# Patient Record
Sex: Male | Born: 1955 | Race: Black or African American | Hispanic: No | Marital: Married | Smoking: Current every day smoker
Health system: Southern US, Community
[De-identification: ages and names within clinical notes are randomized; demographics above are authoritative.]

## PROBLEM LIST (undated history)

## (undated) DIAGNOSIS — E785 Hyperlipidemia, unspecified: Secondary | ICD-10-CM

## (undated) DIAGNOSIS — E119 Type 2 diabetes mellitus without complications: Secondary | ICD-10-CM

## (undated) DIAGNOSIS — R51 Headache: Secondary | ICD-10-CM

## (undated) DIAGNOSIS — C679 Malignant neoplasm of bladder, unspecified: Secondary | ICD-10-CM

## (undated) DIAGNOSIS — I10 Essential (primary) hypertension: Secondary | ICD-10-CM

## (undated) HISTORY — PX: RECTAL SURGERY: SHX760

## (undated) HISTORY — PX: KNEE ARTHROSCOPY: SUR90

## (undated) HISTORY — PX: NASAL POLYP SURGERY: SHX186

---

## 1989-08-11 DIAGNOSIS — R51 Headache: Secondary | ICD-10-CM

## 1989-08-11 HISTORY — DX: Headache: R51

## 1998-12-14 ENCOUNTER — Encounter: Payer: Self-pay | Admitting: Orthopedic Surgery

## 1998-12-14 ENCOUNTER — Ambulatory Visit (HOSPITAL_COMMUNITY): Admission: RE | Admit: 1998-12-14 | Discharge: 1998-12-14 | Payer: Self-pay | Admitting: Orthopedic Surgery

## 2000-03-19 ENCOUNTER — Ambulatory Visit (HOSPITAL_COMMUNITY): Admission: RE | Admit: 2000-03-19 | Discharge: 2000-03-19 | Payer: Self-pay | Admitting: *Deleted

## 2004-01-28 ENCOUNTER — Ambulatory Visit (HOSPITAL_COMMUNITY): Admission: RE | Admit: 2004-01-28 | Discharge: 2004-01-28 | Payer: Self-pay | Admitting: Chiropractic Medicine

## 2007-04-10 ENCOUNTER — Ambulatory Visit: Payer: Self-pay | Admitting: Cardiology

## 2007-04-25 ENCOUNTER — Ambulatory Visit: Payer: Self-pay

## 2007-04-25 ENCOUNTER — Encounter: Payer: Self-pay | Admitting: Cardiovascular Disease

## 2008-09-25 ENCOUNTER — Ambulatory Visit: Payer: Self-pay | Admitting: Gastroenterology

## 2008-10-09 ENCOUNTER — Ambulatory Visit: Payer: Self-pay | Admitting: Gastroenterology

## 2009-04-10 DEATH — deceased

## 2011-04-28 NOTE — Assessment & Plan Note (Signed)
[facility]                            CARDIOLOGY OFFICE NOTE   [name], [name]                         MRN:          [id number]  DATE:04/10/2007                            DOB:          12/09/1956    REASON FOR CONSULTATION:  Abnormal electrocardiogram.   HISTORY OF PRESENT ILLNESS:  Mr. Gabriel [name] is a pleasant 55 year old male  with a history of hypertension and remote tobacco use as well as  possibly mild hyperlipidemia. He has been on Norvasc over the last three  years or so and states that generally he does fairly well as far as  symptoms. He was seen recently for a routine physical and  electrocardiogram was obtained. The copy of this tracing shows sinus  bradycardia at 57 beats per minute with left ventricular hypertrophy and  probable repolarization abnormalities manifested as ST changes in the  inferolateral leads. Mr. [name] denies any typical exertional chest pain  or limiting dyspnea. He works at [employer] and has to lift fairly heavy objects, doing this  without any significant chest pain. He does describe occasionally when  he gets a cough that he may hurt some under his arms and he also  experiences gas pain relating this to sometimes after he eats fast  foods. He also seems to have increased salt intake in reviewing his  diet. He has not had any prior cardiac risk stratification and Dr.  [name] referral note indicates request for an exercise echocardiogram.  I have reviewed the situation with the patient and discussed basic  lifestyle modification, salt restriction, diet and exercise. We talked  about basic exercise echocardiogram for risk stratification and he is in  agreement.   ALLERGIES:  No known drug allergies.   PRESENT MEDICATIONS:  Norvasc 10 mg p.o. daily.   PAST MEDICAL HISTORY:  Is as outlined above. The patient also reports a  prior history of cluster headaches that were treated  with prednisone and  verapamil, although he has not had problems with this for many years.   SOCIAL HISTORY:  The patient is married. He has three children. He works  at [employer]. He has a remote  tobacco use history, but quit 15 years ago. Denies any alcohol use.   FAMILY HISTORY:  Noncontributory for premature cardiovascular disease in  primary relatives. He does state that he has an aunt perhaps in her late  87s who had coronary artery bypass surgery.   REVIEW OF SYSTEMS:  As described in History of Present Illness,  otherwise negative.   PHYSICAL EXAMINATION:  Blood pressure today is 138/82, heart rate 76,  weight 195 pounds. The patient is normally nourished and in no acute  distress.  HEENT: Conjunctivae is normal. Oropharynx is clear.  NECK: Supple, without elevated jugular pressure, without bruits. No  thyromegaly is noted.  LUNGS:  Are clear without labored breathing at rest.  CARDIAC: Reveals a regular rate and rhythm with soft S4. No loud murmur,  S3 gallop. No pericardial rub.  ABDOMEN: Soft and nontender. No  hepatomegaly. No bruits.  EXTREMITIES: Show no significant pitting edema. Distal pulses are 2+.  SKIN: No ulcer changes.  MUSCULOSKELETAL: No kyphosis.  NEURO/PSYCHIATRIC: The patient is alert and oriented x3, affect is  normal.   IMPRESSION:  1. Abnormal electrocardiogram in the setting of hypertension, most      likely consistent with left ventricular hypertrophy and      repolarization abnormalities. Other cardiac risk factors include      gender and potentially mild hyperlipidemia. He has had no further      cardiac risk stratification. Our plan will be an exercise      echocardiogram. I expect that if this is overall low risk he will      not need any additional cardiac testing at this time and can be      followed along by Dr.  Perrin Maltese for basic lifestyle modification. We      will plan to let him know the results and  can arrange followup from      there as necessary.  2. Further plans to follow.     [name], MD  Electronically Signed    SGM/MedQ  DD: 04/10/2007  DT: 04/10/2007  Job #: [id number]   cc:   [name], M.D.

## 2011-08-17 ENCOUNTER — Ambulatory Visit: Payer: Self-pay

## 2011-08-17 ENCOUNTER — Other Ambulatory Visit: Payer: Self-pay | Admitting: Occupational Medicine

## 2011-08-17 DIAGNOSIS — Z021 Encounter for pre-employment examination: Secondary | ICD-10-CM

## 2011-12-12 DIAGNOSIS — E119 Type 2 diabetes mellitus without complications: Secondary | ICD-10-CM

## 2011-12-12 HISTORY — DX: Type 2 diabetes mellitus without complications: E11.9

## 2013-09-01 ENCOUNTER — Other Ambulatory Visit: Payer: Self-pay | Admitting: Urology

## 2013-09-04 ENCOUNTER — Encounter (HOSPITAL_BASED_OUTPATIENT_CLINIC_OR_DEPARTMENT_OTHER): Payer: Self-pay | Admitting: *Deleted

## 2013-09-08 ENCOUNTER — Encounter (HOSPITAL_BASED_OUTPATIENT_CLINIC_OR_DEPARTMENT_OTHER): Payer: Self-pay | Admitting: *Deleted

## 2013-09-08 NOTE — Progress Notes (Signed)
09/08/13 1006  OBSTRUCTIVE SLEEP APNEA  Have you ever been diagnosed with sleep apnea through a sleep study? No  Do you snore loudly (loud enough to be heard through closed doors)?  1  Do you often feel tired, fatigued, or sleepy during the daytime? 1  Has anyone observed you stop breathing during your sleep? 0  Do you have, or are you being treated for high blood pressure? 1  BMI more than 35 kg/m2? 0  Age over 55 years old? 0  Neck circumference greater than 40 cm/18 inches? 0  Gender: 1  Obstructive Sleep Apnea Score 4

## 2013-09-08 NOTE — Progress Notes (Signed)
To [facility] at 0645 Istat 8,Ekg on arrival-Npo after Mn-instructed to take amlodipine with small amt water that am.

## 2013-09-08 NOTE — Progress Notes (Signed)
This patient has screened at an elevated risk for Obstructive Sleep Apnea using the STOP -Bang tool during a pre-op assessment.

## 2013-09-12 ENCOUNTER — Encounter (HOSPITAL_BASED_OUTPATIENT_CLINIC_OR_DEPARTMENT_OTHER)
Admission: RE | Disposition: A | Discharge status: Home / Self Care | Payer: Self-pay | Source: Ambulatory Visit | Attending: Urology

## 2013-09-12 ENCOUNTER — Encounter (HOSPITAL_BASED_OUTPATIENT_CLINIC_OR_DEPARTMENT_OTHER): Payer: Self-pay | Admitting: Anesthesiology

## 2013-09-12 ENCOUNTER — Other Ambulatory Visit: Payer: Self-pay

## 2013-09-12 ENCOUNTER — Ambulatory Visit (HOSPITAL_BASED_OUTPATIENT_CLINIC_OR_DEPARTMENT_OTHER): Payer: Managed Care, Other (non HMO) | Admitting: Anesthesiology

## 2013-09-12 ENCOUNTER — Encounter (HOSPITAL_BASED_OUTPATIENT_CLINIC_OR_DEPARTMENT_OTHER): Payer: Self-pay | Admitting: *Deleted

## 2013-09-12 ENCOUNTER — Ambulatory Visit (HOSPITAL_BASED_OUTPATIENT_CLINIC_OR_DEPARTMENT_OTHER)
Admission: RE | Admit: 2013-09-12 | Discharge: 2013-09-12 | Disposition: A | Discharge status: Home / Self Care | Payer: Managed Care, Other (non HMO) | Source: Ambulatory Visit | Attending: Urology | Admitting: Urology

## 2013-09-12 ENCOUNTER — Ambulatory Visit (HOSPITAL_COMMUNITY): Payer: Managed Care, Other (non HMO)

## 2013-09-12 DIAGNOSIS — E119 Type 2 diabetes mellitus without complications: Secondary | ICD-10-CM | POA: Insufficient documentation

## 2013-09-12 DIAGNOSIS — C679 Malignant neoplasm of bladder, unspecified: Secondary | ICD-10-CM | POA: Insufficient documentation

## 2013-09-12 DIAGNOSIS — I10 Essential (primary) hypertension: Secondary | ICD-10-CM | POA: Insufficient documentation

## 2013-09-12 DIAGNOSIS — N281 Cyst of kidney, acquired: Secondary | ICD-10-CM | POA: Insufficient documentation

## 2013-09-12 HISTORY — PX: TRANSURETHRAL RESECTION OF BLADDER TUMOR WITH GYRUS (TURBT-GYRUS): SHX6458

## 2013-09-12 HISTORY — DX: Headache: R51

## 2013-09-12 HISTORY — PX: CYSTOSCOPY W/ URETERAL STENT PLACEMENT: SHX1429

## 2013-09-12 HISTORY — DX: Type 2 diabetes mellitus without complications: E11.9

## 2013-09-12 HISTORY — DX: Malignant neoplasm of bladder, unspecified: C67.9

## 2013-09-12 HISTORY — DX: Essential (primary) hypertension: I10

## 2013-09-12 LAB — POCT I-STAT, CHEM 8
BUN: 16 mg/dL (ref 6–23)
Chloride: 103 mEq/L (ref 96–112)
Creatinine, Ser: 0.9 mg/dL (ref 0.50–1.35)
HCT: 38 % — ABNORMAL LOW (ref 39.0–52.0)
Hemoglobin: 12.9 g/dL — ABNORMAL LOW (ref 13.0–17.0)
Potassium: 3.6 mEq/L (ref 3.5–5.1)
Sodium: 143 mEq/L (ref 135–145)

## 2013-09-12 SURGERY — TRANSURETHRAL RESECTION OF BLADDER TUMOR WITH GYRUS (TURBT-GYRUS)
Anesthesia: General | Site: Ureter | Wound class: Clean Contaminated

## 2013-09-12 MED ORDER — SODIUM CHLORIDE 0.9 % IR SOLN
Status: DC | PRN
  Administered 2013-09-12: 9000 mL via INTRAVESICAL

## 2013-09-12 MED ORDER — OXYCODONE-ACETAMINOPHEN 5-325 MG PO TABS: 1.0000 | ORAL_TABLET | ORAL | Status: DC | PRN

## 2013-09-12 MED ORDER — KETOROLAC TROMETHAMINE 30 MG/ML IJ SOLN
INTRAMUSCULAR | Status: DC | PRN
  Administered 2013-09-12: 30 mg via INTRAVENOUS

## 2013-09-12 MED ORDER — SUCCINYLCHOLINE CHLORIDE 20 MG/ML IJ SOLN
INTRAMUSCULAR | Status: DC | PRN
  Administered 2013-09-12: 100 mg via INTRAVENOUS

## 2013-09-12 MED ORDER — FENTANYL CITRATE 0.05 MG/ML IJ SOLN
25.0000 ug | INTRAMUSCULAR | Status: DC | PRN
  Filled 2013-09-12: qty 1

## 2013-09-12 MED ORDER — EPHEDRINE SULFATE 50 MG/ML IJ SOLN
INTRAMUSCULAR | Status: DC | PRN
  Administered 2013-09-12: 5 mg via INTRAVENOUS
  Administered 2013-09-12: 10 mg via INTRAVENOUS

## 2013-09-12 MED ORDER — ONDANSETRON HCL 4 MG/2ML IJ SOLN
INTRAMUSCULAR | Status: DC | PRN
  Administered 2013-09-12: 4 mg via INTRAVENOUS

## 2013-09-12 MED ORDER — LACTATED RINGERS IV SOLN
INTRAVENOUS | Status: DC
  Administered 2013-09-12 (×2): via INTRAVENOUS
  Filled 2013-09-12: qty 1000

## 2013-09-12 MED ORDER — PHENAZOPYRIDINE HCL 200 MG PO TABS: 200.0000 mg | ORAL_TABLET | Freq: Three times a day (TID) | ORAL | Status: DC | PRN

## 2013-09-12 MED ORDER — PROMETHAZINE HCL 25 MG/ML IJ SOLN
6.2500 mg | INTRAMUSCULAR | Status: DC | PRN
  Filled 2013-09-12: qty 1

## 2013-09-12 MED ORDER — DEXAMETHASONE SODIUM PHOSPHATE 4 MG/ML IJ SOLN
INTRAMUSCULAR | Status: DC | PRN
  Administered 2013-09-12: 8 mg via INTRAVENOUS

## 2013-09-12 MED ORDER — IOHEXOL 300 MG/ML  SOLN
INTRAMUSCULAR | Status: DC | PRN
  Administered 2013-09-12: 14 mL

## 2013-09-12 MED ORDER — SENNOSIDES-DOCUSATE SODIUM 8.6-50 MG PO TABS: 1.0000 | ORAL_TABLET | Freq: Two times a day (BID) | ORAL | Status: DC

## 2013-09-12 MED ORDER — GLYCOPYRROLATE 0.2 MG/ML IJ SOLN
INTRAMUSCULAR | Status: DC | PRN
  Administered 2013-09-12: 0.6 mg via INTRAVENOUS

## 2013-09-12 MED ORDER — MIDAZOLAM HCL 5 MG/5ML IJ SOLN
INTRAMUSCULAR | Status: DC | PRN
  Administered 2013-09-12: 2 mg via INTRAVENOUS

## 2013-09-12 MED ORDER — ROCURONIUM BROMIDE 100 MG/10ML IV SOLN
INTRAVENOUS | Status: DC | PRN
  Administered 2013-09-12: 10 mg via INTRAVENOUS
  Administered 2013-09-12: 30 mg via INTRAVENOUS

## 2013-09-12 MED ORDER — EPHEDRINE SULFATE 50 MG/ML IJ SOLN: INTRAMUSCULAR | Status: DC | PRN

## 2013-09-12 MED ORDER — OXYCODONE-ACETAMINOPHEN 5-325 MG PO TABS
1.0000 | ORAL_TABLET | ORAL | Status: DC | PRN
  Administered 2013-09-12: 1 via ORAL
  Filled 2013-09-12: qty 1

## 2013-09-12 MED ORDER — KETOROLAC TROMETHAMINE 30 MG/ML IJ SOLN
15.0000 mg | Freq: Once | INTRAMUSCULAR | Status: DC | PRN
  Filled 2013-09-12: qty 1

## 2013-09-12 MED ORDER — LIDOCAINE HCL (CARDIAC) 20 MG/ML IV SOLN
INTRAVENOUS | Status: DC | PRN
  Administered 2013-09-12: 100 mg via INTRAVENOUS

## 2013-09-12 MED ORDER — GENTAMICIN SULFATE 40 MG/ML IJ SOLN
5.0000 mg/kg | Freq: Once | INTRAVENOUS | Status: DC
  Filled 2013-09-12: qty 11

## 2013-09-12 MED ORDER — GENTAMICIN SULFATE 40 MG/ML IJ SOLN
5.0000 mg/kg | Freq: Once | INTRAVENOUS | Status: AC
  Administered 2013-09-12: 440 mg via INTRAVENOUS
  Filled 2013-09-12: qty 11

## 2013-09-12 MED ORDER — NEOSTIGMINE METHYLSULFATE 1 MG/ML IJ SOLN
INTRAMUSCULAR | Status: DC | PRN
  Administered 2013-09-12: 4 mg via INTRAVENOUS

## 2013-09-12 MED ORDER — PHENAZOPYRIDINE HCL 200 MG PO TABS
200.0000 mg | ORAL_TABLET | Freq: Three times a day (TID) | ORAL | Status: DC
  Administered 2013-09-12: 200 mg via ORAL
  Filled 2013-09-12: qty 1

## 2013-09-12 MED ORDER — FENTANYL CITRATE 0.05 MG/ML IJ SOLN
INTRAMUSCULAR | Status: DC | PRN
  Administered 2013-09-12 (×2): 50 ug via INTRAVENOUS

## 2013-09-12 MED ORDER — GENTAMICIN IN SALINE 1.6-0.9 MG/ML-% IV SOLN
80.0000 mg | INTRAVENOUS | Status: DC
  Filled 2013-09-12: qty 50

## 2013-09-12 MED ORDER — PROPOFOL 10 MG/ML IV BOLUS
INTRAVENOUS | Status: DC | PRN
  Administered 2013-09-12: 200 mg via INTRAVENOUS

## 2013-09-12 SURGICAL SUPPLY — 35 items
BAG DRN ANRFLXCHMBR STRAP LEK (BAG)
BAG URINE DRAINAGE (UROLOGICAL SUPPLIES) IMPLANT
BAG URINE LEG 19OZ MD ST LTX (BAG) IMPLANT
BAG URINE LEG 500ML (DRAIN) IMPLANT
BAG URO CATCHER STRL LF (DRAPE) ×3 IMPLANT
BASKET LASER NITINOL 1.9FR (BASKET) IMPLANT
BASKET ZERO TIP NITINOL 2.4FR (BASKET) IMPLANT
BSKT STON RTRVL 120 1.9FR (BASKET)
BSKT STON RTRVL ZERO TP 2.4FR (BASKET)
CATH FOLEY 2WAY SLVR  5CC 22FR (CATHETERS)
CATH FOLEY 2WAY SLVR 30CC 20FR (CATHETERS) IMPLANT
CATH FOLEY 2WAY SLVR 5CC 22FR (CATHETERS) IMPLANT
CATH INTERMIT  6FR 70CM (CATHETERS) ×3 IMPLANT
CLOTH BEACON ORANGE TIMEOUT ST (SAFETY) ×3 IMPLANT
DRAPE CAMERA CLOSED 9X96 (DRAPES) ×3 IMPLANT
ELECT LOOP MED HF 24F 12D CBL (CLIP) ×1 IMPLANT
ELECT REM PT RETURN 9FT ADLT (ELECTROSURGICAL)
ELECT RESECT VAPORIZE 12D CBL (ELECTRODE) IMPLANT
ELECTRODE REM PT RTRN 9FT ADLT (ELECTROSURGICAL) ×2 IMPLANT
EVACUATOR MICROVAS BLADDER (UROLOGICAL SUPPLIES) IMPLANT
GLOVE BIO SURGEON STRL SZ7.5 (GLOVE) ×3 IMPLANT
GLOVE BIOGEL M STER SZ 6 (GLOVE) ×1 IMPLANT
GLOVE BIOGEL PI IND STRL 6.5 (GLOVE) IMPLANT
GLOVE BIOGEL PI INDICATOR 6.5 (GLOVE) ×1
GOWN PREVENTION PLUS XLARGE (GOWN DISPOSABLE) ×2 IMPLANT
GOWN STRL NON-REIN LRG LVL3 (GOWN DISPOSABLE) ×4 IMPLANT
GUIDEWIRE ANG ZIPWIRE 038X150 (WIRE) ×3 IMPLANT
GUIDEWIRE STR DUAL SENSOR (WIRE) ×3 IMPLANT
IV NS IRRIG 3000ML ARTHROMATIC (IV SOLUTION) ×5 IMPLANT
KIT ASPIRATION TUBING (SET/KITS/TRAYS/PACK) IMPLANT
PACK CYSTOSCOPY (CUSTOM PROCEDURE TRAY) ×3 IMPLANT
SET ASPIRATION TUBING (TUBING) IMPLANT
SYRINGE 10CC LL (SYRINGE) ×3 IMPLANT
SYRINGE IRR TOOMEY STRL 70CC (SYRINGE) ×1 IMPLANT
TUBE FEEDING 8FR 16IN STR KANG (MISCELLANEOUS) ×1 IMPLANT

## 2013-09-12 NOTE — Brief Op Note (Signed)
09/12/2013  9:15 AM  PATIENT:  Gabriel [name]  57 y.o. male  PRE-OPERATIVE DIAGNOSIS:  BLADDER CANCER  POST-OPERATIVE DIAGNOSIS:  BLADDER CANCER  PROCEDURE:  Procedure(s) with comments: TRANSURETHRAL RESECTION OF BLADDER TUMOR WITH GYRUS (TURBT-GYRUS) (N/A) - 1 HR  CYSTOSCOPY WITH BILATERAL  RETROGRADE PYELOGRAM   (Bilateral)  SURGEON:  Surgeon(s) and Role:    * [name], MD - Primary  PHYSICIAN ASSISTANT:   ASSISTANTS: none   ANESTHESIA:   general  EBL:  Total I/O In: 200 [I.V.:200] Out: -   BLOOD ADMINISTERED:none  DRAINS: none   LOCAL MEDICATIONS USED:  NONE  SPECIMEN:  Source of Specimen:  1 - Bladder Tumor, 2 - Base of bladder tumor  DISPOSITION OF SPECIMEN:  PATHOLOGY  COUNTS:  YES  TOURNIQUET:  * No tourniquets in log *  DICTATION: .Other Dictation: Dictation Number [id number]  PLAN OF CARE: Discharge to home after PACU  PATIENT DISPOSITION:  PACU - hemodynamically stable.   Delay start of Pharmacological VTE agent (>24hrs) due to surgical blood loss or risk of bleeding: not applicable

## 2013-09-12 NOTE — Anesthesia Procedure Notes (Signed)
Procedure Name: LMA Insertion Date/Time: 09/12/2013 8:26 AM Performed by: Renella Cunas D Pre-anesthesia Checklist: Patient identified, Emergency Drugs available, Suction available and Patient being monitored Patient Re-evaluated:Patient Re-evaluated prior to inductionOxygen Delivery Method: Circle System Utilized Preoxygenation: Pre-oxygenation with 100% oxygen Intubation Type: IV induction Ventilation: Mask ventilation without difficulty LMA: LMA inserted LMA Size: 5.0 Number of attempts: 1 Airway Equipment and Method: bite block Placement Confirmation: positive ETCO2 Tube secured with: Tape Dental Injury: Teeth and Oropharynx as per pre-operative assessment

## 2013-09-12 NOTE — Progress Notes (Signed)
Dr. Berneice Heinrich called and reported patient unable to void and bladder scan results of 698 ml earlier, see order

## 2013-09-12 NOTE — Op Note (Signed)
NAME:  Gabriel [name], LEGE NO.:  [id number]  MEDICAL RECORD NO.:  [id number]  LOCATION:                                 FACILITY:  PHYSICIAN:  [name], MD     DATE OF BIRTH:  1956-06-01  DATE OF PROCEDURE: 09/12/2013 DATE OF DISCHARGE:                              OPERATIVE REPORT   DIAGNOSIS:  Large volume bladder cancer.  PROCEDURE: 1. Transurethral Resection of bladder tumor volume large. 2. Bilateral retrograde pyelograms with interpretation.  SPECIMEN: 1. Bladder tumor. 2. Base of bladder tumor.  FINDINGS: 1. Massive papillary appearing multifocal bladder tumor involving     approximately 30% total bladder circumference mostly lateral to the     left ureteral orifice.  Separate foci posterior. 2. No evidence of perforation following resection and no evidence of     damage to the ureteral orifices following resection. 3. Unremarkable bilateral pyelogram.  ESTIMATED BLOOD LOSS:  Nil.  INDICATION:  Mr. Gabriel [name] is a pleasant 57 year old gentleman with recent history of gross hematuria.  He was found on workup of this to have a very large bladder mass on CT scan.  He refused office cystoscopy. Options were discussed including operative endoscopy with transection of the presumed bladder tumor bilateral pyelograms.  He wished to proceed. Informed consent was obtained and placed in the medical record.  PROCEDURE IN DETAIL:  The patient being Gabriel [name].  The procedure being transection of bladder tumor with retrograde was confirmed.  Procedure was carried out.  Time-out was performed.  Intravenous antibiotics were administered.  General LMA anesthesia was induced.  The patient was placed into a low lithotomy position.  Sterile field was created by prepping the patient's penis, perineum, proximal thighs using iodine x3. Next, cystourethroscopy was performed using a 22-French rigid scope with 12-degree offset lens.  Inspection of the anterior-posterior  urethra was unremarkable.  Inspection of bladder revealed a massive amount of papillary bladder tumor.  It was multifocal most situated lateral to the left ureteral orifice tracking up towards the dome and a separate foci posteriorly.  This came within about 1.5 cm of the left ureteral orifice but it did not involve the directly.  Attention was directed at retrograde pyelograms 1st on the left side.  The left ureteral orifice cannulated 6-French end-hole catheter and left retrograde pyelogram seen.  Left retrograde pyelogram demonstrated a single left ureter, single system left kidney.  No filling defects or narrowing noted.  Attention was then directed at right retrograde pyelogram.  Right retrograde pyelogram demonstrated a single right ureter, single system, right kidney.  No filling defects or narrowing noted.    Next, the cystoscope was exchanged for a 26-French ACMI continuous flow resectoscope sheath using a medium loop.  Very careful resection was taken down what appeared to be flush with the bladder wall at these separate sites.  Exquisite care was taken to avoid any application of energy within 1 cm of the ureteral orifice to avoid damage which did not occur grossly.  As we came towards the area presumed obturator nerve, it was verified that the patient had muscle relaxation with no twitches and there is no evidence  of obturator reflex during this aspect of the resection.  Next, separate cold cup biopsies were taken what appear to the fibromuscular layer of the deep margin of the bladder tumor.  These were set aside labeled base of bladder tumor.  The remaining bladder tumor fragments irrigated and set aside labeled bladder tumor.  Repeat panendoscopy of the urinary bladder, urethra revealed no evidence of perforation.  Excellent hemostasis.  No obvious residual gross tumor. No evidence of damage to the ureteral orifices.  Photodocumentation was performed pre and post  resection.  It was felt that a Foley catheter was not indicated as such bladder was emptied per cystoscope.  Procedure was then terminated.  The patient tolerated the procedure well.  There were no immediate periprocedural complications.  The patient was taken to postanesthesia care in stable condition.          ______________________________ [name], MD     TM/MEDQ  D:  09/12/2013  T:  09/12/2013  Job:  [id number]

## 2013-09-12 NOTE — Anesthesia Preprocedure Evaluation (Signed)
Anesthesia Evaluation  Patient identified by MRN, date of birth, ID band Patient awake    Reviewed: Allergy & Precautions, H&P , NPO status , Patient's Chart, lab work & pertinent test results  Airway Mallampati: II TM Distance: >3 FB Neck ROM: Full    Dental no notable dental hx.    Pulmonary neg pulmonary ROS,  breath sounds clear to auscultation  Pulmonary exam normal       Cardiovascular hypertension, Pt. on medications Rhythm:Regular Rate:Normal     Neuro/Psych negative neurological ROS  negative psych ROS   GI/Hepatic negative GI ROS, Neg liver ROS,   Endo/Other  negative endocrine ROSdiabetes, Type 2  Renal/GU negative Renal ROS  negative genitourinary   Musculoskeletal negative musculoskeletal ROS (+)   Abdominal   Peds negative pediatric ROS (+)  Hematology negative hematology ROS (+)   Anesthesia Other Findings   Reproductive/Obstetrics negative OB ROS                           Anesthesia Physical Anesthesia Plan  ASA: II  Anesthesia Plan: General   Post-op Pain Management:    Induction: Intravenous  Airway Management Planned: LMA  Additional Equipment:   Intra-op Plan:   Post-operative Plan:   Informed Consent: I have reviewed the patients History and Physical, chart, labs and discussed the procedure including the risks, benefits and alternatives for the proposed anesthesia with the patient or authorized representative who has indicated his/her understanding and acceptance.   Dental advisory given  Plan Discussed with: CRNA and Surgeon  Anesthesia Plan Comments:         Anesthesia Quick Evaluation

## 2013-09-12 NOTE — Progress Notes (Signed)
In and cath using sterile technique, with immediate return 1000 ml orange/ pink urine with small blood clots.

## 2013-09-12 NOTE — Transfer of Care (Signed)
Immediate Anesthesia Transfer of Care Note  Patient: Gabriel [name]  Procedure(s) Performed: Procedure(s) (LRB): TRANSURETHRAL RESECTION OF BLADDER TUMOR WITH GYRUS (TURBT-GYRUS) (N/A) CYSTOSCOPY WITH BILATERAL  RETROGRADE PYELOGRAM   (Bilateral)  Patient Location: PACU  Anesthesia Type: General  Level of Consciousness: awake, oriented, sedated and patient cooperative  Airway & Oxygen Therapy: Patient Spontanous Breathing and Patient connected to face mask oxygen  Post-op Assessment: Report given to PACU RN and Post -op Vital signs reviewed and stable  Post vital signs: Reviewed and stable  Complications: No apparent anesthesia complications

## 2013-09-12 NOTE — H&P (Signed)
Gabriel [name] is an 57 y.o. male.    Chief Complaint: Pre-OP Transurethral Resection of Bladder Tumor  HPI:     1 - Bladder Cancer -  Pt wtih 4cm enhancing left sided bladder mass by CT Urogram on w/u gross hematuria 08/2013. Remote 20PY smoker, now quit. No dye / textile exposure.    2 - Left Simple Renal Cyst - 3.1 cm left lower pole cyst incidetntal on CT Urogram 08/2013. No complex features.   PMH sig for HTN, DM2. No CV disease. No strong blood thinners.  Today Kaymon is seen to proceed with transurethral resection of bladder tumor to further diagnose, stage, and manage his large left bladder wall mass.      Past Medical History  Diagnosis Date  . Bladder cancer   . Hypertension   . Diabetes mellitus without complication 2013    borderline- not taking prescribed med  . Headache(784.0) 1990's    not current problem    Past Surgical History  Procedure Laterality Date  . Knee arthroscopy Right many years ago-    doesn't remember date  . Nasal polyp surgery  date unknown  . Rectal surgery  date unknown    History reviewed. No pertinent family history. Social History:  reports that he quit smoking about 23 years ago. He does not have any smokeless tobacco history on file. He reports that he does not drink alcohol or use illicit drugs.  Allergies: No Known Allergies  No prescriptions prior to admission    No results found for this or any previous visit (from the past 48 hour(s)). No results found.  Review of Systems  Constitutional: Negative.  Negative for fever and chills.  HENT: Negative.   Eyes: Negative.   Respiratory: Negative.   Cardiovascular: Negative.   Gastrointestinal: Negative.   Genitourinary: Positive for hematuria.  Musculoskeletal: Negative.   Skin: Negative.   Neurological: Negative.   Endo/Heme/Allergies: Negative.   Psychiatric/Behavioral: Negative.     Height 6\' 2"  (1.88 m), weight 88.905 kg (196 lb). Physical Exam  Constitutional: He is  oriented to person, place, and time. He appears well-developed and well-nourished.  HENT:  Head: Normocephalic and atraumatic.  Eyes: EOM are normal. Pupils are equal, round, and reactive to light.  Neck: Normal range of motion. Neck supple.  Cardiovascular: Normal rate and regular rhythm.   Respiratory: Effort normal and breath sounds normal.  GI: Soft. Bowel sounds are normal.  Genitourinary: Penis normal.  No CVAT  Musculoskeletal: Normal range of motion.  Neurological: He is alert and oriented to person, place, and time.  Skin: Skin is warm and dry.  Psychiatric: He has a normal mood and affect. His behavior is normal. Judgment and thought content normal.     Assessment/Plan     1 - Bladder Cancer - We rediscussed operative biopsy / transurethral resection as the best next step for diagnostic and therapeutic purposes with goals being to remove all visible cancer and obtain tissue for pathologic exam. We rediscussed that for some low-grade tumors, this may be all the treatment required, but that for many other tumors such as high-grade lesions, further therapy including surgery and or chemotherapy may be warranted. We also outlined the fact that any bladder cancer diagnosis will require close follow-up with periodic upper and lower tract evaluation. We rediscussed risks including bleeding, infection, damage to kidney / ureter / bladder including bladder perforation which can typically managed with prolonged foley catheterization. We rementioned anesthetic and other rare risks including  DVT, PE, MI, and mortality. I also mentioned that adjunctive procedures such as ureteral stenting, retrograde pyelography, and ureteroscopy may be necessary to fully evaluate the urinary tract depending on intra-operative findings.   After answering all questions to the patient's satisfaction, they wish to proceed. I reinforced that he will likely need left ureteral stent given location.  2 - Left Simple Renal  Cyst - non-complex. No mass effect. No indiation for specific surveillance or intervention, observe.   Lonnell Chaput 09/12/2013, 6:17 AM

## 2013-09-12 NOTE — Op Note (Deleted)
NAME:  Gabriel [name], VALLEY NO.:  [id number]  MEDICAL RECORD NO.:  [id number]  LOCATION:                                 FACILITY:  PHYSICIAN:  [name], MD     DATE OF BIRTH:  1956-01-24  DATE OF PROCEDURE: 09/12/2013 DATE OF DISCHARGE:                              OPERATIVE REPORT   DIAGNOSIS:  Large volume bladder cancer.  PROCEDURE: 1. Transurethral Resection of bladder tumor volume large. 2. Bilateral retrograde pyelograms with interpretation.  SPECIMEN: 1. Bladder tumor. 2. Base of bladder tumor.  FINDINGS: 1. Massive papillary appearing multifocal bladder tumor involving     approximately 30% total bladder circumference mostly lateral to the     left ureteral orifice.  Separate foci posterior. 2. No evidence of perforation following resection and no evidence of     damage to the ureteral orifices following resection. 3. Unremarkable bilateral pyelogram.  ESTIMATED BLOOD LOSS:  Nil.  INDICATION:  Gabriel [name] is a pleasant 57 year old gentleman with recent history of gross hematuria.  He was found on workup of this to have a very large bladder mass on CT scan.  He refused office cystoscopy. Options were discussed including operative endoscopy with transection of the presumed bladder tumor bilateral pyelograms.  He wished to proceed. Informed consent was obtained and placed in the medical record.  PROCEDURE IN DETAIL:  The patient being Gabriel [name].  The procedure being transection of bladder tumor with retrograde was confirmed.  Procedure was carried out.  Time-out was performed.  Intravenous antibiotics were administered.  General LMA anesthesia was induced.  The patient was placed into a low lithotomy position.  Sterile field was created by prepping the patient's penis, perineum, proximal thighs using iodine x3. Next, cystourethroscopy was performed using a 22-French rigid scope with 12-degree offset lens.  Inspection of the anterior-posterior  drawer unremarkable.  Inspection of bladder revealed a massive amount of papillary bladder tumor.  It was multifocal most situated lateral to the left ureteral orifice tracking up towards the dome and a separate foci posteriorly.  This came within about 1.5 cm of the left ureteral orifice but it did not involve the directly.  Attention was directed at retrograde pyelograms 1st on the left side.  The left ureteral orifice cannulated 6-French end-hole catheter and left retrograde pyelogram seen.  Left retrograde pyelogram demonstrated a single left ureter, single system left kidney.  No filling defects or narrowing noted.  Attention was then directed at right retrograde pyelogram.  Right retrograde pyelogram demonstrated a single right ureter, single system, right kidney.  No filling defects or narrowing noted.    Next, the cystoscope was exchanged for a 26-French ACMI continuous flow resectoscope sheath using a medium loop.  Very careful resection was taken down what appeared to be flush with the bladder wall at these separate sites.  Exclusive care was taken to avoid any application of energy within 1 cm of the ureteral orifice to avoid damage which did not occur grossly.  As we came towards the area presumed obturator nerve, it was verified that the patient had muscle relaxation with no twitches and there is no evidence of  obturator reflex during this aspect of the resection.  Next, separate cold cup biopsies were taken what appear to the fibromuscular layer of the deep margin of the bladder tumor.  These were set aside labeled base of bladder tumor.  The remaining bladder tumor fragments irrigated and set aside labeled bladder tumor.  Repeat panendoscopy of the urinary bladder, urethra revealed no evidence of perforation.  Excellent hemostasis.  No obvious residual gross tumor. No evidence of damage to the ureteral orifices.  Photodocumentation was performed pre and post resection.   It was felt that a Foley catheter was not indicated as such bladder was emptied per cystoscope.  Procedure was then terminated.  The patient tolerated the procedure well.  There were no immediate periprocedural complications.  The patient was taken to postanesthesia care in stable condition.          ______________________________ [name], MD     TM/MEDQ  D:  09/12/2013  T:  09/12/2013  Job:  [id number]

## 2013-09-12 NOTE — Anesthesia Postprocedure Evaluation (Signed)
  Anesthesia Post-op Note  Patient: Gabriel [name]  Procedure(s) Performed: Procedure(s) (LRB): TRANSURETHRAL RESECTION OF BLADDER TUMOR WITH GYRUS (TURBT-GYRUS) (N/A) CYSTOSCOPY WITH BILATERAL  RETROGRADE PYELOGRAM   (Bilateral)  Patient Location: PACU  Anesthesia Type: General  Level of Consciousness: awake and alert   Airway and Oxygen Therapy: Patient Spontanous Breathing  Post-op Pain: mild  Post-op Assessment: Post-op Vital signs reviewed, Patient's Cardiovascular Status Stable, Respiratory Function Stable, Patent Airway and No signs of Nausea or vomiting  Last Vitals:  Filed Vitals:   09/12/13 0945  BP: 107/64  Pulse: 63  Temp:   Resp: 14    Post-op Vital Signs: stable   Complications: No apparent anesthesia complications

## 2013-09-15 ENCOUNTER — Encounter (HOSPITAL_BASED_OUTPATIENT_CLINIC_OR_DEPARTMENT_OTHER): Payer: Self-pay | Admitting: Urology

## 2014-07-27 ENCOUNTER — Encounter: Payer: Self-pay | Admitting: Gastroenterology

## 2015-08-24 ENCOUNTER — Encounter: Payer: Self-pay | Admitting: Gastroenterology

## 2019-07-02 ENCOUNTER — Other Ambulatory Visit: Payer: Self-pay

## 2019-07-02 DIAGNOSIS — Z20822 Contact with and (suspected) exposure to covid-19: Secondary | ICD-10-CM

## 2019-07-06 LAB — NOVEL CORONAVIRUS, NAA: SARS-CoV-2, NAA: NOT DETECTED

## 2020-07-09 DIAGNOSIS — M62838 Other muscle spasm: Secondary | ICD-10-CM | POA: Diagnosis not present

## 2020-08-09 DIAGNOSIS — I10 Essential (primary) hypertension: Secondary | ICD-10-CM | POA: Diagnosis not present

## 2020-08-09 DIAGNOSIS — M2180 Other specified acquired deformities of unspecified limb: Secondary | ICD-10-CM | POA: Diagnosis not present

## 2020-12-21 DIAGNOSIS — H2513 Age-related nuclear cataract, bilateral: Secondary | ICD-10-CM | POA: Diagnosis not present

## 2021-01-20 DIAGNOSIS — R0789 Other chest pain: Secondary | ICD-10-CM | POA: Diagnosis not present

## 2021-01-25 DIAGNOSIS — E119 Type 2 diabetes mellitus without complications: Secondary | ICD-10-CM | POA: Diagnosis not present

## 2021-02-12 DIAGNOSIS — M79671 Pain in right foot: Secondary | ICD-10-CM | POA: Diagnosis not present

## 2021-02-22 DIAGNOSIS — I739 Peripheral vascular disease, unspecified: Secondary | ICD-10-CM | POA: Diagnosis not present

## 2021-02-22 DIAGNOSIS — M7751 Other enthesopathy of right foot: Secondary | ICD-10-CM | POA: Diagnosis not present

## 2021-02-25 DIAGNOSIS — R0789 Other chest pain: Secondary | ICD-10-CM | POA: Diagnosis not present

## 2021-03-17 DIAGNOSIS — M2011 Hallux valgus (acquired), right foot: Secondary | ICD-10-CM | POA: Diagnosis not present

## 2021-03-17 DIAGNOSIS — M19071 Primary osteoarthritis, right ankle and foot: Secondary | ICD-10-CM | POA: Diagnosis not present

## 2021-04-07 DIAGNOSIS — R2241 Localized swelling, mass and lump, right lower limb: Secondary | ICD-10-CM | POA: Diagnosis not present

## 2021-04-07 DIAGNOSIS — E1159 Type 2 diabetes mellitus with other circulatory complications: Secondary | ICD-10-CM | POA: Diagnosis not present

## 2021-04-07 DIAGNOSIS — I152 Hypertension secondary to endocrine disorders: Secondary | ICD-10-CM | POA: Diagnosis not present

## 2021-04-07 DIAGNOSIS — E785 Hyperlipidemia, unspecified: Secondary | ICD-10-CM | POA: Diagnosis not present

## 2021-04-07 DIAGNOSIS — E1169 Type 2 diabetes mellitus with other specified complication: Secondary | ICD-10-CM | POA: Diagnosis not present

## 2021-04-07 DIAGNOSIS — Z125 Encounter for screening for malignant neoplasm of prostate: Secondary | ICD-10-CM | POA: Diagnosis not present

## 2021-04-07 DIAGNOSIS — E1165 Type 2 diabetes mellitus with hyperglycemia: Secondary | ICD-10-CM | POA: Diagnosis not present

## 2021-04-28 LAB — COLOGUARD

## 2021-05-11 DIAGNOSIS — I152 Hypertension secondary to endocrine disorders: Secondary | ICD-10-CM | POA: Diagnosis not present

## 2021-05-11 DIAGNOSIS — Z23 Encounter for immunization: Secondary | ICD-10-CM | POA: Diagnosis not present

## 2021-05-11 DIAGNOSIS — E1159 Type 2 diabetes mellitus with other circulatory complications: Secondary | ICD-10-CM | POA: Diagnosis not present

## 2021-05-11 DIAGNOSIS — E1165 Type 2 diabetes mellitus with hyperglycemia: Secondary | ICD-10-CM | POA: Diagnosis not present

## 2021-07-04 ENCOUNTER — Encounter: Payer: Self-pay | Admitting: Podiatry

## 2021-07-04 ENCOUNTER — Ambulatory Visit (INDEPENDENT_AMBULATORY_CARE_PROVIDER_SITE_OTHER): Payer: BC Managed Care – PPO | Admitting: Podiatry

## 2021-07-04 ENCOUNTER — Other Ambulatory Visit: Payer: Self-pay

## 2021-07-04 ENCOUNTER — Ambulatory Visit (INDEPENDENT_AMBULATORY_CARE_PROVIDER_SITE_OTHER): Payer: BC Managed Care – PPO

## 2021-07-04 DIAGNOSIS — R200 Anesthesia of skin: Secondary | ICD-10-CM | POA: Diagnosis not present

## 2021-07-04 DIAGNOSIS — M21619 Bunion of unspecified foot: Secondary | ICD-10-CM

## 2021-07-04 DIAGNOSIS — G629 Polyneuropathy, unspecified: Secondary | ICD-10-CM | POA: Diagnosis not present

## 2021-07-06 NOTE — Progress Notes (Signed)
Subjective:   Patient ID: Gabriel [name], male   DOB: 65 y.o.   MRN: [id number]   HPI Patient presents stating he started to develop quite a bit of numbness on the outside of his right foot and also is concerned about structural bunion deformity and does work a weightbearing job extended hours.  He does not smoke and is active   Review of Systems  All other systems reviewed and are negative.      Objective:  Physical Exam Vitals and nursing note reviewed.  Constitutional:      Appearance: He is well-developed.  Pulmonary:     Effort: Pulmonary effort is normal.  Musculoskeletal:        General: Normal range of motion.  Skin:    General: Skin is warm.  Neurological:     Mental Status: He is alert.    Neurovascular status was found to be intact with patient found to have mild diminishment of sharp dull vibratory just in a dermatomal pattern on the outside plantar aspect of the right foot that is localized.  He did not have any muscle strength loss and did have good range of motion and was found to have good digital perfusion well oriented.  He has moderate structural bunion deformity bilateral which is not significantly tender     Assessment:  Possibility for some kind of a neuropraxia versus a possibility of a compression in the back versus the possibility of other unknown cause of neuropathy that is not currently causing balance issues or other pathology     Plan:  H&P spent a great deal time educating him on this and at this point I have recommended a wait-and-see attitude and I do think it will gradually resolve even though if he gets worse we will consider neuro or neurological consult or possibly nerve biopsies of this area.  I advised on bunions do not recommend current treatment but did discuss shoe gear modifications to be undertaken  X-rays indicate significant structural bunion did not indicate any lateral foot pathology right

## 2021-12-06 DIAGNOSIS — M25511 Pain in right shoulder: Secondary | ICD-10-CM | POA: Diagnosis not present

## 2021-12-11 DIAGNOSIS — I739 Peripheral vascular disease, unspecified: Secondary | ICD-10-CM

## 2021-12-11 HISTORY — DX: Peripheral vascular disease, unspecified: I73.9

## 2021-12-19 ENCOUNTER — Ambulatory Visit (INDEPENDENT_AMBULATORY_CARE_PROVIDER_SITE_OTHER): Payer: BC Managed Care – PPO | Admitting: Podiatry

## 2021-12-19 ENCOUNTER — Other Ambulatory Visit: Payer: Self-pay

## 2021-12-19 ENCOUNTER — Encounter: Payer: Self-pay | Admitting: Podiatry

## 2021-12-19 DIAGNOSIS — I999 Unspecified disorder of circulatory system: Secondary | ICD-10-CM

## 2021-12-19 DIAGNOSIS — G629 Polyneuropathy, unspecified: Secondary | ICD-10-CM

## 2021-12-19 DIAGNOSIS — M21619 Bunion of unspecified foot: Secondary | ICD-10-CM

## 2021-12-19 NOTE — Progress Notes (Signed)
Subjective:   Patient ID: Gabriel [name], male   DOB: 66 y.o.   MRN: [id number]   HPI Patient continues to complain of symptoms with his right calf muscle and into the right side of the foot stating that when he does any form of activity it seems to cramp up and occur and its not gotten better over these periods of months since we saw him last and he is not seek any other treatment   ROS      Objective:  Physical Exam  Upon full evaluation neurovascular I did note that there is a diminishment of vascular pulses right over left with PT not able to feel and the DP being weaker.  He upon aggressive questioning appears to be dealing more with claudication symptoms versus neuropathic symptoms     Assessment:  H&P reviewed condition educated Gabriel [name] on condition and the possibility this could be vascular given his claudication-like symptomatology versus neurological      Plan:  H&P reviewed condition with him and discussed different possibilities at great length.  At this point I am getting send him for vascular studies and if it turns out there is reduction consideration for clot and revascularization.  If nothing is found we may have to consider neurological consult or other treatment and it is possible that this will be idiopathic and we cannot find a cause of the cramping he is experiencing.  Put the consultation in today for vascular studies

## 2021-12-23 ENCOUNTER — Other Ambulatory Visit: Payer: Self-pay | Admitting: Podiatry

## 2021-12-23 DIAGNOSIS — I999 Unspecified disorder of circulatory system: Secondary | ICD-10-CM

## 2021-12-30 ENCOUNTER — Ambulatory Visit (HOSPITAL_COMMUNITY)
Admission: RE | Admit: 2021-12-30 | Discharge: 2021-12-30 | Disposition: A | Discharge status: Home / Self Care | Payer: BC Managed Care – PPO | Source: Ambulatory Visit | Attending: Cardiology | Admitting: Cardiology

## 2021-12-30 ENCOUNTER — Other Ambulatory Visit: Payer: Self-pay

## 2021-12-30 DIAGNOSIS — I999 Unspecified disorder of circulatory system: Secondary | ICD-10-CM | POA: Insufficient documentation

## 2022-01-17 ENCOUNTER — Encounter: Payer: Self-pay | Admitting: Cardiovascular Disease

## 2022-01-17 ENCOUNTER — Other Ambulatory Visit: Payer: Self-pay

## 2022-01-17 ENCOUNTER — Ambulatory Visit (INDEPENDENT_AMBULATORY_CARE_PROVIDER_SITE_OTHER): Payer: BC Managed Care – PPO | Admitting: Cardiovascular Disease

## 2022-01-17 VITALS — BP 160/85 | HR 73 | Resp 20 | Ht 74.0 in | Wt 189.0 lb

## 2022-01-17 DIAGNOSIS — E785 Hyperlipidemia, unspecified: Secondary | ICD-10-CM

## 2022-01-17 DIAGNOSIS — R0989 Other specified symptoms and signs involving the circulatory and respiratory systems: Secondary | ICD-10-CM | POA: Diagnosis not present

## 2022-01-17 DIAGNOSIS — I739 Peripheral vascular disease, unspecified: Secondary | ICD-10-CM | POA: Diagnosis not present

## 2022-01-17 DIAGNOSIS — Z720 Tobacco use: Secondary | ICD-10-CM

## 2022-01-17 DIAGNOSIS — Z01812 Encounter for preprocedural laboratory examination: Secondary | ICD-10-CM

## 2022-01-17 DIAGNOSIS — I10 Essential (primary) hypertension: Secondary | ICD-10-CM

## 2022-01-17 DIAGNOSIS — Z01818 Encounter for other preprocedural examination: Secondary | ICD-10-CM | POA: Diagnosis not present

## 2022-01-17 LAB — LIPID PANEL
Chol/HDL Ratio: 5.7 ratio — ABNORMAL HIGH (ref 0.0–5.0)
Cholesterol, Total: 198 mg/dL (ref 100–199)
HDL: 35 mg/dL — ABNORMAL LOW (ref >39)
LDL Chol Calc (NIH): 99 mg/dL (ref 0–99)
Triglycerides: 379 mg/dL — ABNORMAL HIGH (ref 0–149)
VLDL Cholesterol Cal: 64 mg/dL — ABNORMAL HIGH (ref 5–40)

## 2022-01-17 LAB — CBC
Hematocrit: 37 % — ABNORMAL LOW (ref 37.5–51.0)
Hemoglobin: 13.2 g/dL (ref 13.0–17.7)
MCH: 31.3 pg (ref 26.6–33.0)
MCHC: 35.7 g/dL (ref 31.5–35.7)
MCV: 88 fL (ref 79–97)
Platelets: 314 10*3/uL (ref 150–450)
RBC: 4.22 x10E6/uL (ref 4.14–5.80)
RDW: 12.5 % (ref 11.6–15.4)
WBC: 7.4 10*3/uL (ref 3.4–10.8)

## 2022-01-17 LAB — COMPREHENSIVE METABOLIC PANEL
ALT: 16 IU/L (ref 0–44)
AST: 19 IU/L (ref 0–40)
Albumin/Globulin Ratio: 1.3 (ref 1.2–2.2)
Albumin: 4.4 g/dL (ref 3.8–4.8)
Alkaline Phosphatase: 92 IU/L (ref 44–121)
BUN/Creatinine Ratio: 13 (ref 10–24)
BUN: 17 mg/dL (ref 8–27)
Bilirubin Total: 0.3 mg/dL (ref 0.0–1.2)
CO2: 31 mmol/L — ABNORMAL HIGH (ref 20–29)
Calcium: 9.7 mg/dL (ref 8.6–10.2)
Chloride: 98 mmol/L (ref 96–106)
Creatinine, Ser: 1.29 mg/dL — ABNORMAL HIGH (ref 0.76–1.27)
Globulin, Total: 3.4 g/dL (ref 1.5–4.5)
Glucose: 144 mg/dL — ABNORMAL HIGH (ref 70–99)
Potassium: 4 mmol/L (ref 3.5–5.2)
Sodium: 138 mmol/L (ref 134–144)
Total Protein: 7.8 g/dL (ref 6.0–8.5)
eGFR: 62 mL/min/{1.73_m2} (ref >59)

## 2022-01-17 MED ORDER — SODIUM CHLORIDE 0.9% FLUSH: 3.0000 mL | Freq: Two times a day (BID) | INTRAVENOUS | Status: DC

## 2022-01-17 MED ORDER — ATORVASTATIN CALCIUM 20 MG PO TABS: 20.0000 mg | ORAL_TABLET | Freq: Every day | ORAL | 3 refills | Status: DC

## 2022-01-17 NOTE — Progress Notes (Signed)
Cardiology Office Note   Date:  01/17/2022   ID:  Gabriel [name], DOB 1955/12/31, MRN [id number]  PCP:  Bernerd Limbo, MD  Cardiologist:   [name], MD   No chief complaint on file.     History of Present Illness: Gabriel [name] is a 66 y.o. male who was referred by Dr. Paulla Dolly for evaluation and management of peripheral arterial disease. He has no previous cardiac history.  He has known history of essential hypertension, borderline diabetes and previous bladder cancer.  He smokes cigars daily.  He reports family history of coronary artery disease. He was seen by [facility] cardiology last year for atypical chest pain.  He underwent a treadmill nuclear stress test which showed no evidence of ischemia. He reports symptoms of severe right calf claudication and numbness in both feet especially the right side that started less than a year ago and has been progressive since then.  Claudication is now happening after short distance walking and he has to stop and rest before he can resume.  He has no rest pain or lower extremity ulceration.  No chest pain or shortness of breath.  He underwent noninvasive vascular studies which showed an ABI of 0.63 on the right and 0.88 on the left.  Duplex showed severe stenosis in the right distal SFA and moderate left SFA disease.   Past Medical History:  Diagnosis Date   Bladder cancer ([place])    Diabetes mellitus without complication ([place]) 0630   borderline- not taking prescribed med   Headache(784.0) 1990's   not current problem   Hypertension     Past Surgical History:  Procedure Laterality Date   CYSTOSCOPY W/ URETERAL STENT PLACEMENT Bilateral 09/12/2013   Procedure: CYSTOSCOPY WITH BILATERAL  RETROGRADE PYELOGRAM  ;  Surgeon: Alexis Frock, MD;  Location: [facility];  Service: Urology;  Laterality: Bilateral;   KNEE ARTHROSCOPY Right many years ago-   doesn't remember date   NASAL POLYP SURGERY  date unknown   RECTAL SURGERY  date  unknown   TRANSURETHRAL RESECTION OF BLADDER TUMOR WITH GYRUS (TURBT-GYRUS) N/A 09/12/2013   Procedure: TRANSURETHRAL RESECTION OF BLADDER TUMOR WITH GYRUS (TURBT-GYRUS);  Surgeon: Alexis Frock, MD;  Location: [facility];  Service: Urology;  Laterality: N/A;  1 HR      Current Outpatient Medications  Medication Sig Dispense Refill   aspirin 81 MG EC tablet Take 1 tablet by mouth daily.     cloNIDine (CATAPRES) 0.1 MG tablet Take by mouth 2 (two) times daily.     glyBURIDE-metformin (GLUCOVANCE) 2.5-500 MG tablet Take 1 tablet by mouth 2 (two) times daily.     Olmesartan-amLODIPine-HCTZ 40-10-25 MG TABS Take 1 tablet by mouth daily.     oxyCODONE-acetaminophen (ROXICET) 5-325 MG per tablet Take 1 tablet by mouth every 4 (four) hours as needed for pain. Post-operatively (Patient not taking: Reported on 01/17/2022) 30 tablet 0   No current facility-administered medications for this visit.    Allergies:   Patient has no known allergies.    Social History:  The patient  reports that he quit smoking about 31 years ago. His smoking use included cigarettes. He does not have any smokeless tobacco history on file. He reports that he does not drink alcohol and does not use drugs.   Family History:  [name] family history is remarkable for coronary artery disease and hypertension.   ROS:  Please see the history of present illness.   Otherwise, review  of systems are positive for none.   All other systems are reviewed and negative.    PHYSICAL EXAM: VS:  BP (!) 160/85 (BP Location: Left Arm, Patient Position: Sitting, Cuff Size: Normal)    Pulse 73    Resp 20    Ht 6\' 2"  (1.88 m)    Wt 189 lb (85.7 kg)    SpO2 98%    BMI 24.27 kg/m  , BMI Body mass index is 24.27 kg/m. GEN: Well nourished, well developed, in no acute distress  HEENT: normal  Neck: no JVD,  or masses.  Right carotid bruit. Cardiac: RRR; no murmurs, rubs, or gallops,no edema  Respiratory:  clear to  auscultation bilaterally, normal work of breathing GI: soft, nontender, nondistended, + BS MS: no deformity or atrophy  Skin: warm and dry, no rash Neuro:  Strength and sensation are intact Psych: euthymic mood, full affect Vascular: Femoral pulses are normal bilaterally.  Radial pulses are normal.  Distal pulses are not palpable on the right side and faint on the left.   EKG:  EKG is ordered today. The ekg ordered today demonstrates normal sinus rhythm with LVH with repolarization abnormalities.   Recent Labs: No results found for requested labs within last 8760 hours.    Lipid Panel No results found for: CHOL, TRIG, HDL, CHOLHDL, VLDL, LDLCALC, LDLDIRECT    Wt Readings from Last 3 Encounters:  01/17/22 189 lb (85.7 kg)  09/12/13 194 lb 8 oz (88.2 kg)       No flowsheet data found.    ASSESSMENT AND PLAN:  1.  Peripheral arterial disease with severe lifestyle limiting claudication of the right calf Rutherford class III.  I discussed with him the natural history and management of claudication.  I discussed with him the importance of controlling his risk factors.  His symptoms have been progressing and clearly lifestyle limiting.  This has significantly affected his ability to perform his work and daily living.  Due to that, I recommend proceeding with abdominal aortogram with lower extremity runoff and possible endovascular intervention.  I discussed the procedure in details as well as risks and benefits.  2.  Right carotid bruit: I requested carotid Doppler.  3.  Hyperlipidemia: He reports no history of hyperlipidemia but given that he is diabetic and now with diagnosis of peripheral arterial disease, I recommend treatment with a statin.  I added atorvastatin 20 mg daily.  We will check lipid and liver profile with his labs today.  4.  Essential hypertension: He seems to have refractory hypertension.  We will check for renal artery stenosis with the upcoming  angiography.  5.  Cigar use: I discussed with him the importance of cessation.    Disposition: Proceed with a lower extremity angiogram and follow-up in 1 month.  Signed,  [name], MD  01/17/2022 11:23 AM    [place]

## 2022-01-17 NOTE — H&P (View-Only) (Signed)
Cardiology Office Note   Date:  01/17/2022   ID:  Gabriel [name], DOB [birth year], MRN [id number]  PCP:  Bernerd Limbo, MD  Cardiologist:   [name], MD   No chief complaint on file.     History of Present Illness: Gabriel [name] is a 66 y.o. male who was referred by Dr. Paulla Dolly for evaluation and management of peripheral arterial disease. He has no previous cardiac history.  He has known history of essential hypertension, borderline diabetes and previous bladder cancer.  He smokes cigars daily.  He reports family history of coronary artery disease. He was seen by [facility] cardiology last year for atypical chest pain.  He underwent a treadmill nuclear stress test which showed no evidence of ischemia. He reports symptoms of severe right calf claudication and numbness in both feet especially the right side that started less than a year ago and has been progressive since then.  Claudication is now happening after short distance walking and he has to stop and rest before he can resume.  He has no rest pain or lower extremity ulceration.  No chest pain or shortness of breath.  He underwent noninvasive vascular studies which showed an ABI of 0.63 on the right and 0.88 on the left.  Duplex showed severe stenosis in the right distal SFA and moderate left SFA disease.   Past Medical History:  Diagnosis Date   Bladder cancer ([place])    Diabetes mellitus without complication ([place]) 5449   borderline- not taking prescribed med   Headache(784.0) 1990's   not current problem   Hypertension     Past Surgical History:  Procedure Laterality Date   CYSTOSCOPY W/ URETERAL STENT PLACEMENT Bilateral 09/12/2013   Procedure: CYSTOSCOPY WITH BILATERAL  RETROGRADE PYELOGRAM  ;  Surgeon: Alexis Frock, MD;  Location: [facility];  Service: Urology;  Laterality: Bilateral;   KNEE ARTHROSCOPY Right many years ago-   doesn't remember date   NASAL POLYP SURGERY  date unknown   RECTAL SURGERY  date  unknown   TRANSURETHRAL RESECTION OF BLADDER TUMOR WITH GYRUS (TURBT-GYRUS) N/A 09/12/2013   Procedure: TRANSURETHRAL RESECTION OF BLADDER TUMOR WITH GYRUS (TURBT-GYRUS);  Surgeon: Alexis Frock, MD;  Location: [facility];  Service: Urology;  Laterality: N/A;  1 HR      Current Outpatient Medications  Medication Sig Dispense Refill   aspirin 81 MG EC tablet Take 1 tablet by mouth daily.     cloNIDine (CATAPRES) 0.1 MG tablet Take by mouth 2 (two) times daily.     glyBURIDE-metformin (GLUCOVANCE) 2.5-500 MG tablet Take 1 tablet by mouth 2 (two) times daily.     Olmesartan-amLODIPine-HCTZ 40-10-25 MG TABS Take 1 tablet by mouth daily.     oxyCODONE-acetaminophen (ROXICET) 5-325 MG per tablet Take 1 tablet by mouth every 4 (four) hours as needed for pain. Post-operatively (Patient not taking: Reported on 01/17/2022) 30 tablet 0   No current facility-administered medications for this visit.    Allergies:   Patient has no known allergies.    Social History:  The patient  reports that he quit smoking about 31 years ago. His smoking use included cigarettes. He does not have any smokeless tobacco history on file. He reports that he does not drink alcohol and does not use drugs.   Family History:  [name] family history is remarkable for coronary artery disease and hypertension.   ROS:  Please see the history of present illness.   Otherwise, review  of systems are positive for none.   All other systems are reviewed and negative.    PHYSICAL EXAM: VS:  BP (!) 160/85 (BP Location: Left Arm, Patient Position: Sitting, Cuff Size: Normal)    Pulse 73    Resp 20    Ht 6\' 2"  (1.88 m)    Wt 189 lb (85.7 kg)    SpO2 98%    BMI 24.27 kg/m  , BMI Body mass index is 24.27 kg/m. GEN: Well nourished, well developed, in no acute distress  HEENT: normal  Neck: no JVD,  or masses.  Right carotid bruit. Cardiac: RRR; no murmurs, rubs, or gallops,no edema  Respiratory:  clear to  auscultation bilaterally, normal work of breathing GI: soft, nontender, nondistended, + BS MS: no deformity or atrophy  Skin: warm and dry, no rash Neuro:  Strength and sensation are intact Psych: euthymic mood, full affect Vascular: Femoral pulses are normal bilaterally.  Radial pulses are normal.  Distal pulses are not palpable on the right side and faint on the left.   EKG:  EKG is ordered today. The ekg ordered today demonstrates normal sinus rhythm with LVH with repolarization abnormalities.   Recent Labs: No results found for requested labs within last 8760 hours.    Lipid Panel No results found for: CHOL, TRIG, HDL, CHOLHDL, VLDL, LDLCALC, LDLDIRECT    Wt Readings from Last 3 Encounters:  01/17/22 189 lb (85.7 kg)  09/12/13 194 lb 8 oz (88.2 kg)       No flowsheet data found.    ASSESSMENT AND PLAN:  1.  Peripheral arterial disease with severe lifestyle limiting claudication of the right calf Rutherford class III.  I discussed with him the natural history and management of claudication.  I discussed with him the importance of controlling his risk factors.  His symptoms have been progressing and clearly lifestyle limiting.  This has significantly affected his ability to perform his work and daily living.  Due to that, I recommend proceeding with abdominal aortogram with lower extremity runoff and possible endovascular intervention.  I discussed the procedure in details as well as risks and benefits.  2.  Right carotid bruit: I requested carotid Doppler.  3.  Hyperlipidemia: He reports no history of hyperlipidemia but given that he is diabetic and now with diagnosis of peripheral arterial disease, I recommend treatment with a statin.  I added atorvastatin 20 mg daily.  We will check lipid and liver profile with his labs today.  4.  Essential hypertension: He seems to have refractory hypertension.  We will check for renal artery stenosis with the upcoming  angiography.  5.  Cigar use: I discussed with him the importance of cessation.    Disposition: Proceed with a lower extremity angiogram and follow-up in 1 month.  Signed,  [name], MD  01/17/2022 11:23 AM    [place]

## 2022-01-17 NOTE — Patient Instructions (Addendum)
Medication Instructions:  START Atorvastatin 20 mg once daily  *If you need a refill on your cardiac medications before your next appointment, please call your pharmacy*  Testing/Procedures: Your physician has requested that you have a peripheral vascular angiogram. This exam is performed at the hospital. During this exam IV contrast is used to look at arterial blood flow. Please review the information sheet given for details.  Your physician has requested that you have a carotid duplex. This test is an ultrasound of the carotid arteries in your neck. It looks at blood flow through these arteries that supply the brain with blood. Allow one hour for this exam. There are no restrictions or special instructions. This will take place at [place], [address].   Follow-Up: At [facility], you and your health needs are our priority.  As part of our continuing mission to provide you with exceptional heart care, we have created designated Provider Care Teams.  These Care Teams include your primary Cardiologist (physician) and Advanced Practice Providers (APPs -  Physician Assistants and Nurse Practitioners) who all work together to provide you with the care you need, when you need it.  We recommend signing up for the patient portal called "MyChart".  Sign up information is provided on this After Visit Summary.  MyChart is used to connect with patients for Virtual Visits (Telemedicine).  Patients are able to view lab/test results, encounter notes, upcoming appointments, etc.  Non-urgent messages can be sent to your provider as well.   To learn more about what you can do with MyChart, go to [email or web address].    Your next appointment:   Keep your follow up with Suezanne Cheshire, PA at 1:55 on 02/15/22  Other Instructions  [place] [place] [address] Dept: [phone] Loc:  [place]  01/17/2022  You are scheduled for a Peripheral Angiogram on Wednesday, February 15 with Dr. [name].  1. Please arrive at the [facility] (Main Entrance A) at [facility]: [address] [place], [place] [address] at 8:30 AM (This time is two hours before your procedure to ensure your preparation). Free valet parking service is available.   Special note: Every effort is made to have your procedure done on time. Please understand that emergencies sometimes delay scheduled procedures.  2. Diet: Do not eat solid foods after midnight.  The patient may have clear liquids until 5am upon the day of the procedure.  3. Labs: You will need to have blood drawn on 01/17/22  4. Medication instructions in preparation for your procedure: Hold all diabetic medication the morning of the procedure. You will hold the glyburide-metformin the morning of and then 48 hours after.  On the morning of your procedure, take your Aspirin and any morning medicines NOT listed above.  You may use sips of water.  5. Plan for one night stay--bring personal belongings. 6. Bring a current list of your medications and current insurance cards. 7. You MUST have a responsible person to drive you home. 8. Someone MUST be with you the first 24 hours after you arrive home or your discharge will be delayed. 9. Please wear clothes that are easy to get on and off and wear slip-on shoes.  Thank you for allowing [place] to care for you!   -- [facility]

## 2022-01-18 ENCOUNTER — Telehealth: Payer: Self-pay | Admitting: Cardiovascular Disease

## 2022-01-18 NOTE — Telephone Encounter (Signed)
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 Advised the patient he will not be able to drive for 24 hours and no heavy lifting. Advised patient he needs to take off Thursday and maybe Friday.

## 2022-01-18 NOTE — Telephone Encounter (Signed)
Patient called stating he is schedule for a procedure next week Wednesday, he wants to know what if he needs to take Thursday off from work or will he be able to go back to work on Thursday.

## 2022-01-24 ENCOUNTER — Telehealth: Payer: Self-pay | Admitting: *Deleted

## 2022-01-24 NOTE — Telephone Encounter (Signed)
Call placed to patient to review procedure instructions, voicemail message. ?

## 2022-01-24 NOTE — Telephone Encounter (Signed)
Reviewed procedure instructions with patient.  

## 2022-01-24 NOTE — Telephone Encounter (Addendum)
Abdominal aortogram scheduled at [facility] for: Wednesday February 15,2023 10:30 AM [facility] Main Entrance A [facility]) at: 8:30 AM   Diet-no solid food after midnight prior to cath, clear liquids until 5 AM day of procedure.  Medication instructions for procedure: -Hold:  Glucovance-day of procedure and 48 hours post procedure  Olmesartan/amlodipine/HCTZ-AM of procedure -Except hold medications usual morning medications can be taken pre-cath with sips of water including aspirin 81 mg.    Must have responsible adult to drive home post procedure and be with patient first 24 hours after arriving home.  [facility] does allow one visitor to wait in the waiting room during the time you are there.    Reviewed procedure instructions with patient.

## 2022-01-25 ENCOUNTER — Other Ambulatory Visit: Payer: Self-pay | Admitting: *Deleted

## 2022-01-25 ENCOUNTER — Ambulatory Visit (HOSPITAL_COMMUNITY)
Admission: RE | Admit: 2022-01-25 | Discharge: 2022-01-25 | Disposition: A | Discharge status: Home / Self Care | Payer: BC Managed Care – PPO | Attending: Cardiovascular Disease | Admitting: Cardiovascular Disease

## 2022-01-25 ENCOUNTER — Ambulatory Visit (HOSPITAL_COMMUNITY)
Admission: RE | Disposition: A | Discharge status: Home / Self Care | Payer: Self-pay | Source: Home / Self Care | Attending: Cardiovascular Disease

## 2022-01-25 ENCOUNTER — Other Ambulatory Visit: Payer: Self-pay

## 2022-01-25 DIAGNOSIS — I739 Peripheral vascular disease, unspecified: Secondary | ICD-10-CM

## 2022-01-25 DIAGNOSIS — I10 Essential (primary) hypertension: Secondary | ICD-10-CM | POA: Insufficient documentation

## 2022-01-25 DIAGNOSIS — R0989 Other specified symptoms and signs involving the circulatory and respiratory systems: Secondary | ICD-10-CM | POA: Diagnosis not present

## 2022-01-25 DIAGNOSIS — Z8551 Personal history of malignant neoplasm of bladder: Secondary | ICD-10-CM | POA: Diagnosis not present

## 2022-01-25 DIAGNOSIS — Z7984 Long term (current) use of oral hypoglycemic drugs: Secondary | ICD-10-CM | POA: Diagnosis not present

## 2022-01-25 DIAGNOSIS — Z79899 Other long term (current) drug therapy: Secondary | ICD-10-CM | POA: Diagnosis not present

## 2022-01-25 DIAGNOSIS — F17290 Nicotine dependence, other tobacco product, uncomplicated: Secondary | ICD-10-CM | POA: Diagnosis not present

## 2022-01-25 DIAGNOSIS — I70211 Atherosclerosis of native arteries of extremities with intermittent claudication, right leg: Secondary | ICD-10-CM | POA: Diagnosis not present

## 2022-01-25 DIAGNOSIS — Z8249 Family history of ischemic heart disease and other diseases of the circulatory system: Secondary | ICD-10-CM | POA: Diagnosis not present

## 2022-01-25 DIAGNOSIS — E785 Hyperlipidemia, unspecified: Secondary | ICD-10-CM | POA: Diagnosis not present

## 2022-01-25 DIAGNOSIS — E1151 Type 2 diabetes mellitus with diabetic peripheral angiopathy without gangrene: Secondary | ICD-10-CM | POA: Insufficient documentation

## 2022-01-25 HISTORY — PX: ABDOMINAL AORTOGRAM W/LOWER EXTREMITY: CATH118223

## 2022-01-25 HISTORY — PX: PERIPHERAL VASCULAR ATHERECTOMY: CATH118256

## 2022-01-25 LAB — POCT ACTIVATED CLOTTING TIME
Activated Clotting Time: 227 seconds
Activated Clotting Time: 233 seconds

## 2022-01-25 LAB — GLUCOSE, CAPILLARY
Glucose-Capillary: 168 mg/dL — ABNORMAL HIGH (ref 70–99)
Glucose-Capillary: 171 mg/dL — ABNORMAL HIGH (ref 70–99)

## 2022-01-25 SURGERY — ABDOMINAL AORTOGRAM W/LOWER EXTREMITY
Anesthesia: LOCAL | Laterality: Right

## 2022-01-25 MED ORDER — HEPARIN (PORCINE) IN NACL 1000-0.9 UT/500ML-% IV SOLN
INTRAVENOUS | Status: AC
  Filled 2022-01-25: qty 500

## 2022-01-25 MED ORDER — ASPIRIN 81 MG PO CHEW: 81.0000 mg | CHEWABLE_TABLET | ORAL | Status: DC

## 2022-01-25 MED ORDER — CLOPIDOGREL BISULFATE 300 MG PO TABS
ORAL_TABLET | ORAL | Status: DC | PRN
Start: 2022-01-25 — End: 2022-01-25
  Administered 2022-01-25: 300 mg via ORAL

## 2022-01-25 MED ORDER — LIDOCAINE HCL (PF) 1 % IJ SOLN
INTRAMUSCULAR | Status: DC | PRN
  Administered 2022-01-25: 15 mL

## 2022-01-25 MED ORDER — SODIUM CHLORIDE 0.9 % WEIGHT BASED INFUSION: 1.0000 mL/kg/h | INTRAVENOUS | Status: DC

## 2022-01-25 MED ORDER — FENTANYL CITRATE (PF) 100 MCG/2ML IJ SOLN
INTRAMUSCULAR | Status: AC
  Filled 2022-01-25: qty 2

## 2022-01-25 MED ORDER — MIDAZOLAM HCL 2 MG/2ML IJ SOLN
INTRAMUSCULAR | Status: AC
  Filled 2022-01-25: qty 2

## 2022-01-25 MED ORDER — SODIUM CHLORIDE 0.9% FLUSH: 3.0000 mL | INTRAVENOUS | Status: DC | PRN

## 2022-01-25 MED ORDER — IODIXANOL 320 MG/ML IV SOLN
INTRAVENOUS | Status: DC | PRN
  Administered 2022-01-25: 145 mL via INTRA_ARTERIAL

## 2022-01-25 MED ORDER — HEPARIN SODIUM (PORCINE) 1000 UNIT/ML IJ SOLN
INTRAMUSCULAR | Status: DC | PRN
Start: 2022-01-25 — End: 2022-01-25
  Administered 2022-01-25: 7000 [IU] via INTRAVENOUS
  Administered 2022-01-25: 2000 [IU] via INTRAVENOUS

## 2022-01-25 MED ORDER — ONDANSETRON HCL 4 MG/2ML IJ SOLN
4.0000 mg | Freq: Four times a day (QID) | INTRAMUSCULAR | Status: DC | PRN
Start: 2022-01-25 — End: 2022-01-25

## 2022-01-25 MED ORDER — MIDAZOLAM HCL 2 MG/2ML IJ SOLN
INTRAMUSCULAR | Status: DC | PRN
  Administered 2022-01-25 (×2): 1 mg via INTRAVENOUS

## 2022-01-25 MED ORDER — HEPARIN SODIUM (PORCINE) 1000 UNIT/ML IJ SOLN
INTRAMUSCULAR | Status: AC
  Filled 2022-01-25: qty 10

## 2022-01-25 MED ORDER — SODIUM CHLORIDE 0.9 % IV SOLN: INTRAVENOUS | Status: DC

## 2022-01-25 MED ORDER — CLOPIDOGREL BISULFATE 300 MG PO TABS
ORAL_TABLET | ORAL | Status: AC
  Filled 2022-01-25: qty 1

## 2022-01-25 MED ORDER — SODIUM CHLORIDE 0.9 % IV SOLN: 250.0000 mL | INTRAVENOUS | Status: DC | PRN

## 2022-01-25 MED ORDER — HEPARIN (PORCINE) IN NACL 1000-0.9 UT/500ML-% IV SOLN
INTRAVENOUS | Status: DC | PRN
  Administered 2022-01-25 (×2): 500 mL

## 2022-01-25 MED ORDER — FENTANYL CITRATE (PF) 100 MCG/2ML IJ SOLN
INTRAMUSCULAR | Status: DC | PRN
  Administered 2022-01-25 (×2): 50 ug via INTRAVENOUS

## 2022-01-25 MED ORDER — CLOPIDOGREL BISULFATE 75 MG PO TABS: 75.0000 mg | ORAL_TABLET | Freq: Every day | ORAL | 6 refills | Status: DC

## 2022-01-25 MED ORDER — ACETAMINOPHEN 325 MG PO TABS: 650.0000 mg | ORAL_TABLET | ORAL | Status: DC | PRN

## 2022-01-25 MED ORDER — SODIUM CHLORIDE 0.9 % WEIGHT BASED INFUSION
3.0000 mL/kg/h | INTRAVENOUS | Status: AC
  Administered 2022-01-25: 3 mL/kg/h via INTRAVENOUS

## 2022-01-25 MED ORDER — LABETALOL HCL 5 MG/ML IV SOLN: 10.0000 mg | INTRAVENOUS | Status: DC | PRN

## 2022-01-25 MED ORDER — SODIUM CHLORIDE 0.9% FLUSH: 3.0000 mL | Freq: Two times a day (BID) | INTRAVENOUS | Status: DC

## 2022-01-25 MED ORDER — LIDOCAINE HCL (PF) 1 % IJ SOLN
INTRAMUSCULAR | Status: AC
  Filled 2022-01-25: qty 30

## 2022-01-25 SURGICAL SUPPLY — 25 items
BALLN IN.PACT DCB 6X150 (BALLOONS) ×3
CATH ANGIO 5F PIGTAIL 65CM (CATHETERS) ×1 IMPLANT
CATH CROSS OVER TEMPO 5F (CATHETERS) ×1 IMPLANT
CATH CXI 4F 150 ST (CATHETERS) ×1 IMPLANT
CATH HAWKONE LS STANDARD TIP (CATHETERS) ×3
CATH HAWKONE LS STD TIP (CATHETERS) IMPLANT
CATH STRAIGHT 5FR 65CM (CATHETERS) ×1 IMPLANT
CLOSURE PERCLOSE PROSTYLE (VASCULAR PRODUCTS) ×1 IMPLANT
DCB IN.PACT 6X150 (BALLOONS) IMPLANT
DEVICE SPIDERFX EMB PROT 7MM (WIRE) ×1 IMPLANT
GLIDEWIRE NITREX 0.018X80X5 (WIRE) ×3
GUIDEWIRE NITREX 0.018X80X5 (WIRE) IMPLANT
KIT ENCORE 26 ADVANTAGE (KITS) ×1 IMPLANT
KIT MICROPUNCTURE NIT STIFF (SHEATH) ×1 IMPLANT
KIT PV (KITS) ×4 IMPLANT
SHEATH PINNACLE 5F 10CM (SHEATH) ×1 IMPLANT
SHEATH PINNACLE ST 7F 65CM (SHEATH) ×1 IMPLANT
SHEATH PROBE COVER 6X72 (BAG) ×1 IMPLANT
SYR MEDRAD MARK 7 150ML (SYRINGE) ×4 IMPLANT
TAPE SHOOT N SEE (TAPE) ×1 IMPLANT
TRANSDUCER W/STOPCOCK (MISCELLANEOUS) ×4 IMPLANT
TRAY PV CATH (CUSTOM PROCEDURE TRAY) ×4 IMPLANT
WIRE HITORQ VERSACORE ST 145CM (WIRE) ×1 IMPLANT
WIRE RUNTHROUGH .014X300CM (WIRE) ×2 IMPLANT
WIRE VERSACORE LOC 115CM (WIRE) ×1 IMPLANT

## 2022-01-25 NOTE — Discharge Instructions (Signed)
Femoral Site Care This sheet gives you information about how to care for yourself after your procedure. Your health care provider may also give you more specific instructions. If you have problems or questions, contact your health care provider. What can I expect after the procedure? After the procedure, it is common to have: Bruising that usually fades within 1-2 weeks. Tenderness at the site. Follow these instructions at home: Wound care May remove bandage after 24 hours. Do not take baths, swim, or use a hot tub for 5 days. You may shower 24-48 hours after the procedure. Gently wash the site with plain soap and water. Pat the area dry with a clean towel. Do not rub the site. This may cause bleeding. Do not apply powder or lotion to the site. Keep the site clean and dry. Check your femoral site every day for signs of infection. Check for: Redness, swelling, or pain. Fluid or blood. Warmth. Pus or a bad smell. Activity For the first 2-3 days after your procedure, or as long as directed: Avoid climbing stairs as much as possible. Do not squat. Do not lift, push or pull anything that is heavier than 10 lb for 5 days. Rest as directed. Avoid sitting for a long time without moving. Get up to take short walks every 1-2 hours. Do not drive for 24 hours. General instructions Take over-the-counter and prescription medicines only as told by your health care provider. Keep all follow-up visits as told by your health care provider. This is important. DRINK PLENTY OF FLUIDS FOR THE NEXT 2-3 DAYS. Contact a health care provider if you have: A fever or chills. You have redness, swelling, or pain around your insertion site. Get help right away if: The catheter insertion area swells very fast. You pass out. You suddenly start to sweat or your skin gets clammy. The catheter insertion area is bleeding, and the bleeding does not stop when you hold steady pressure on the area. The area near or  just beyond the catheter insertion site becomes pale, cool, tingly, or numb. These symptoms may represent a serious problem that is an emergency. Do not wait to see if the symptoms will go away. Get medical help right away. Call your local emergency services (911 in the [place] Do not drive yourself to the hospital. Summary After the procedure, it is common to have bruising that usually fades within 1-2 weeks. Check your femoral site every day for signs of infection. Do not lift, push or pull anything that is heavier than 10 lb for 5 days.  This information is not intended to replace advice given to you by your health care provider. Make sure you discuss any questions you have with your health care provider. Document Revised: 12/10/2017 Document Reviewed: 12/10/2017 Elsevier Patient Education  2020 [employer]  

## 2022-01-25 NOTE — Interval H&P Note (Signed)
History and Physical Interval Note:  01/25/2022 10:50 AM  Gabriel [name]  has presented today for surgery, with the diagnosis of pad.  The various methods of treatment have been discussed with the patient and family. After consideration of risks, benefits and other options for treatment, the patient has consented to  Procedure(s): ABDOMINAL AORTOGRAM W/LOWER EXTREMITY (N/A) as a surgical intervention.  [name] history has been reviewed, patient examined, no change in status, stable for surgery.  I have reviewed the patient's chart and labs.  Questions were answered to the patient's satisfaction.     [name]

## 2022-01-26 ENCOUNTER — Encounter (HOSPITAL_COMMUNITY): Payer: Self-pay | Admitting: Cardiovascular Disease

## 2022-01-26 MED FILL — Clopidogrel Bisulfate Tab 300 MG (Base Equiv): ORAL | Qty: 1 | Status: AC

## 2022-01-26 MED FILL — Lidocaine HCl Local Preservative Free (PF) Inj 1%: INTRAMUSCULAR | Qty: 30 | Status: AC

## 2022-01-30 ENCOUNTER — Other Ambulatory Visit: Payer: Self-pay

## 2022-01-30 ENCOUNTER — Ambulatory Visit (HOSPITAL_COMMUNITY)
Admission: RE | Admit: 2022-01-30 | Discharge: 2022-01-30 | Disposition: A | Discharge status: Home / Self Care | Payer: BC Managed Care – PPO | Source: Ambulatory Visit | Attending: Cardiology | Admitting: Cardiology

## 2022-01-30 DIAGNOSIS — R0989 Other specified symptoms and signs involving the circulatory and respiratory systems: Secondary | ICD-10-CM

## 2022-02-02 ENCOUNTER — Other Ambulatory Visit: Payer: Self-pay | Admitting: *Deleted

## 2022-02-02 DIAGNOSIS — R0989 Other specified symptoms and signs involving the circulatory and respiratory systems: Secondary | ICD-10-CM

## 2022-02-03 ENCOUNTER — Telehealth: Payer: Self-pay | Admitting: Cardiovascular Disease

## 2022-02-03 NOTE — Telephone Encounter (Signed)
Wellington Hampshire, MD  02/02/2022 11:25 AM EST     Moderate right carotid stenosis.  Repeat study in 1 year.   The patient has been notified of the result and verbalized understanding.  All questions (if any) were answered. Darrell Jewel, RN 02/03/2022 5:09 PM

## 2022-02-03 NOTE — Telephone Encounter (Signed)
Patient is returning RN Lisa's call in regards to his vascular results.

## 2022-02-14 ENCOUNTER — Ambulatory Visit: Payer: BC Managed Care – PPO | Admitting: Cardiovascular Disease

## 2022-02-15 ENCOUNTER — Other Ambulatory Visit: Payer: Self-pay

## 2022-02-15 ENCOUNTER — Ambulatory Visit (INDEPENDENT_AMBULATORY_CARE_PROVIDER_SITE_OTHER): Payer: BC Managed Care – PPO | Admitting: Physician Assistant

## 2022-02-15 ENCOUNTER — Encounter: Payer: Self-pay | Admitting: Physician Assistant

## 2022-02-15 VITALS — BP 146/78 | HR 85 | Ht 74.0 in | Wt 188.6 lb

## 2022-02-15 DIAGNOSIS — I739 Peripheral vascular disease, unspecified: Secondary | ICD-10-CM | POA: Diagnosis not present

## 2022-02-15 DIAGNOSIS — I10 Essential (primary) hypertension: Secondary | ICD-10-CM

## 2022-02-15 DIAGNOSIS — I6523 Occlusion and stenosis of bilateral carotid arteries: Secondary | ICD-10-CM

## 2022-02-15 DIAGNOSIS — E785 Hyperlipidemia, unspecified: Secondary | ICD-10-CM

## 2022-02-15 NOTE — Progress Notes (Addendum)
Cardiology Office Note:    Date:  02/17/2022   ID:  Gabriel [name], DOB [birth year], MRN [id number]  PCP:  Bernerd Limbo, MD   [facility] Providers Cardiologist: Dr. Fletcher Anon  Referring MD: Bernerd Limbo, MD   Chief Complaint  Patient presents with   Follow-up    Post LE angiogram    History of Present Illness:    Gabriel [name] is a 66 y.o. male with a hx of PAD, hypertension, borderline diabetes and history of bladder cancer.  Patient reports family history of coronary artery disease.  He previously was seen by [facility] cardiology service for atypical chest pain.  Myoview demonstrated no evidence of ischemia.  He was referred by PCP for evaluation of peripheral arterial disease after complaining of severe right calf claudication and numbness in both feet especially on the right side.  ABI demonstrated right ABI 0.63, left ABI 0.88.  Duplex showed severe stenosis in the right distal SFA and moderate left SFA.  Patient was seen by Dr. Fletcher Anon on 01/17/2022, carotid Doppler showed a left 1 to 39% left ICA disease, 40 to 59% right ICA disease, hemodynamically stable plaque greater than 50% in the common right carotid artery.  Repeat study in 12 months was recommended.  Subclavian flow was normal.  Lipid panel showed a triglyceride of 379, LDL 99.  Emphasis has been placed on tobacco cessation.  20 mg daily of Lipitor was added.  Patient underwent lower extremity angiography on 01/25/2022 which revealed no significant aorto iliac disease, severe subocclusive disease in the distal SFA with two-vessel runoff below the knee, severe proximal disease in the anterior and posterior tibial artery.  Patient underwent successful directional arthrectomy and drug-coated balloon angioplasty to right SFA.  Postprocedure, patient was placed on aspirin and Plavix.  Patient presents today for follow-up.  His right lower extremity claudication symptom has resolved since the recent at right SFA balloon angioplasty.  He  continued to have numbness and tingling on the day lateral side of the right foot.  The degree of numbness does not change with ambulation and occurs all the time does not matter if he is laying down or sitting up or standing up.  I am not confident this numbness in the foot is truly coming from arterial blockage.  His lower leg is no longer painful with ambulation.  I think this is a major improvement.  He will need fasting lipid panel and LFT in 3 to 4 weeks.  We discussed the importance of tobacco cessation and healthy diet.  He like to deep-fried his fish, while eating fish is likely will help with his triglyceride, however I did not recommend deep-fried food.  He has a air Rolly Salter which I consider is a better substitute for deep frying his fish.  He can follow-up in 3 months with Dr. Fletcher Anon  Past Medical History:  Diagnosis Date   Bladder cancer [facility])    Diabetes mellitus without complication ([place]) 7622   borderline- not taking prescribed med   Headache(784.0) 1990's   not current problem   Hypertension     Past Surgical History:  Procedure Laterality Date   ABDOMINAL AORTOGRAM W/LOWER EXTREMITY N/A 01/25/2022   Procedure: ABDOMINAL AORTOGRAM W/LOWER EXTREMITY;  Surgeon: Wellington Hampshire, MD;  Location: [place] [facility];  Service: Cardiovascular;  Laterality: N/A;   CYSTOSCOPY W/ URETERAL STENT PLACEMENT Bilateral 09/12/2013   Procedure: CYSTOSCOPY WITH BILATERAL  RETROGRADE PYELOGRAM  ;  Surgeon: Alexis Frock, MD;  Location: [facility];  Service: Urology;  Laterality: Bilateral;   KNEE ARTHROSCOPY Right many years ago-   doesn't remember date   NASAL POLYP SURGERY  date unknown   PERIPHERAL VASCULAR ATHERECTOMY Right 01/25/2022   Procedure: PERIPHERAL VASCULAR ATHERECTOMY;  Surgeon: Wellington Hampshire, MD;  Location: [place] [facility];  Service: Cardiovascular;  Laterality: Right;   RECTAL SURGERY  date unknown   TRANSURETHRAL RESECTION OF BLADDER TUMOR WITH GYRUS  (TURBT-GYRUS) N/A 09/12/2013   Procedure: TRANSURETHRAL RESECTION OF BLADDER TUMOR WITH GYRUS (TURBT-GYRUS);  Surgeon: Alexis Frock, MD;  Location: [facility];  Service: Urology;  Laterality: N/A;  1 HR     Current Medications: Current Meds  Medication Sig   aspirin 81 MG EC tablet Take 81 mg by mouth daily.   atorvastatin (LIPITOR) 20 MG tablet Take 1 tablet (20 mg total) by mouth daily.   cloNIDine (CATAPRES) 0.1 MG tablet Take 0.1 mg by mouth 2 (two) times daily.   clopidogrel (PLAVIX) 75 MG tablet Take 1 tablet (75 mg total) by mouth daily.   glyBURIDE-metformin (GLUCOVANCE) 2.5-500 MG tablet Take 1 tablet by mouth 2 (two) times daily.   Olmesartan-amLODIPine-HCTZ 40-10-25 MG TABS Take 1 tablet by mouth daily.     Allergies:   Patient has no known allergies.   Social History   Socioeconomic History   Marital status: Married    Spouse name: Not on file   Number of children: Not on file   Years of education: Not on file   Highest education level: Not on file  Occupational History   Not on file  Tobacco Use   Smoking status: Former    Types: Cigarettes    Quit date: 09/08/1990    Years since quitting: 31.4   Smokeless tobacco: Not on file  Substance and Sexual Activity   Alcohol use: No   Drug use: No   Sexual activity: Not on file  Other Topics Concern   Not on file  Social History Narrative   Not on file   Social Determinants of Health   Financial Resource Strain: Not on file  Food Insecurity: Not on file  Transportation Needs: Not on file  Physical Activity: Not on file  Stress: Not on file  Social Connections: Not on file     Family History: [name] family history is not on file.  ROS:   Please see the history of present illness.     All other systems reviewed and are negative.  EKGs/Labs/Other Studies Reviewed:    The following studies were reviewed today:  LE angiogram 01/25/2022 1.  No significant aortoiliac disease. 2.   Right lower extremity: Severe subocclusive disease in the distal SFA with two-vessel runoff below the knee.  Anterior and posterior tibial arteries which has significant proximal disease. 3.  Successful directional atherectomy and drug-coated balloon angioplasty to the right SFA.   Recommendations: Dual antiplatelet therapy for few months. Aggressive treatment of risk factors. The patient does have significant tibial disease on the right side but no indication for treatment if he has no residual symptoms after today's intervention.    EKG:  EKG is not ordered today.   Recent Labs: 01/17/2022: ALT 16; BUN 17; Creatinine, Ser 1.29; Hemoglobin 13.2; Platelets 314; Potassium 4.0; Sodium 138  Recent Lipid Panel    Component Value Date/Time   CHOL 198 01/17/2022 1138   TRIG 379 (H) 01/17/2022 1138   HDL 35 (L) 01/17/2022 1138   CHOLHDL 5.7 (H) 01/17/2022 1138  [place] 99 01/17/2022 1138     Risk Assessment/Calculations:           Physical Exam:    VS:  BP (!) 146/78    Pulse 85    Ht '6\' 2"'$  (1.88 m)    Wt 188 lb 9.6 oz (85.5 kg)    SpO2 100%    BMI 24.21 kg/m     Wt Readings from Last 3 Encounters:  02/15/22 188 lb 9.6 oz (85.5 kg)  01/25/22 190 lb (86.2 kg)  01/17/22 189 lb (85.7 kg)     GEN:  Well nourished, well developed in no acute distress HEENT: Normal NECK: No JVD; No carotid bruits LYMPHATICS: No lymphadenopathy CARDIAC: RRR, no murmurs, rubs, gallops RESPIRATORY:  Clear to auscultation without rales, wheezing or rhonchi  ABDOMEN: Soft, non-tender, non-distended MUSCULOSKELETAL:  No edema; No deformity  SKIN: Warm and dry NEUROLOGIC:  Alert and oriented x 3 PSYCHIATRIC:  Normal affect   ASSESSMENT:    1. PAD (peripheral artery disease) ([place])   2. Hyperlipidemia, unspecified hyperlipidemia type   3. Essential hypertension   4. Bilateral carotid artery stenosis    PLAN:    In order of problems listed above:  PAD: Patient underwent lower extremity  angiography on 01/25/2022 which revealed no significant aorto iliac disease, severe subocclusive disease in the distal SFA with two-vessel runoff below the knee, severe proximal disease in the anterior and posterior tibial artery.  Patient underwent successful directional arthrectomy and drug-coated balloon angioplasty to right SFA.  Postprocedure, patient was placed on aspirin and Plavix.   -Postprocedure, he clearly has improvement, he no longer has lower leg pain on the right side when he walks.   -He continued to have numbness and the tingling sensation on the lateral side of his right foot, however this does not change with the degree of ambulation.  Therefore I am not confident this is truly related to arterial flow issue.  Pending repeat Doppler on 02/17/2022.  Hyperlipidemia: Recently started on Lipitor.  Obtain fasting lipid panel NFT in 3 to 4 weeks  Hypertension: Blood pressure controlled on clonidine, olmesartan-amlodipine-hydrochlorothiazide  Bilateral carotid artery disease: Repeat carotid Doppler in February 2024        Medication Adjustments/Labs and Tests Ordered: Current medicines are reviewed at length with the patient today.  Concerns regarding medicines are outlined above.  Orders Placed This Encounter  Procedures   Lipid panel   Hepatic function panel   No orders of the defined types were placed in this encounter.   Patient Instructions  Medication Instructions:  The current medical regimen is effective;  continue present plan and medications.  *If you need a refill on your cardiac medications before your next appointment, please call your pharmacy*   Lab Work: LIPID, LIVER in 3-4 weeks. (Fasting, nothing to eat or drink, no lab appointment needed)  If you have labs (blood work) drawn today and your tests are completely normal, you will receive your results only by: [place] (if you have MyChart) OR A paper copy in the mail If you have any lab test that  is abnormal or we need to change your treatment, we will call you to review the results.  Follow-Up: At [facility] you and your health needs are our priority.  As part of our continuing mission to provide you with exceptional heart care, we have created designated Provider Care Teams.  These Care Teams include your primary Cardiologist (physician) and Advanced Practice Providers (APPs -  Physician  Assistants and Nurse Practitioners) who all work together to provide you with the care you need, when you need it.  We recommend signing up for the patient portal called "MyChart".  Sign up information is provided on this After Visit Summary.  MyChart is used to connect with patients for Virtual Visits (Telemedicine).  Patients are able to view lab/test results, encounter notes, upcoming appointments, etc.  Non-urgent messages can be sent to your provider as well.   To learn more about what you can do with MyChart, go to [email or web address].    Your next appointment:   3 month(s)  The format for your next appointment:   In Person  Provider:   [name], MD       Signed, Almyra Deforest, [place]  02/17/2022 4:21 PM    [place]

## 2022-02-15 NOTE — Patient Instructions (Signed)
Medication Instructions:  ?The current medical regimen is effective;  continue present plan and medications. ? ?*If you need a refill on your cardiac medications before your next appointment, please call your pharmacy* ? ? ?Lab Work: ?LIPID, LIVER in 3-4 weeks. (Fasting, nothing to eat or drink, no lab appointment needed) ? ?If you have labs (blood work) drawn today and your tests are completely normal, you will receive your results only by: ?MyChart Message (if you have MyChart) OR ?A paper copy in the mail ?If you have any lab test that is abnormal or we need to change your treatment, we will call you to review the results. ? ?Follow-Up: ?At [facility], you and your health needs are our priority.  As part of our continuing mission to provide you with exceptional heart care, we have created designated Provider Care Teams.  These Care Teams include your primary Cardiologist (physician) and Advanced Practice Providers (APPs -  Physician Assistants and Nurse Practitioners) who all work together to provide you with the care you need, when you need it. ? ?We recommend signing up for the patient portal called "MyChart".  Sign up information is provided on this After Visit Summary.  MyChart is used to connect with patients for Virtual Visits (Telemedicine).  Patients are able to view lab/test results, encounter notes, upcoming appointments, etc.  Non-urgent messages can be sent to your provider as well.   ?To learn more about what you can do with MyChart, go to [email or web address].   ? ?Your next appointment:   ?3 month(s) ? ?The format for your next appointment:   ?In Person ? ?Provider:   ?[name], MD  ? ? ? ?

## 2022-02-17 ENCOUNTER — Encounter: Payer: Self-pay | Admitting: Physician Assistant

## 2022-02-17 ENCOUNTER — Other Ambulatory Visit: Payer: Self-pay

## 2022-02-17 ENCOUNTER — Ambulatory Visit (HOSPITAL_COMMUNITY)
Admission: RE | Admit: 2022-02-17 | Discharge: 2022-02-17 | Disposition: A | Discharge status: Home / Self Care | Payer: BC Managed Care – PPO | Source: Ambulatory Visit | Attending: Cardiovascular Disease | Admitting: Cardiovascular Disease

## 2022-02-17 DIAGNOSIS — I739 Peripheral vascular disease, unspecified: Secondary | ICD-10-CM | POA: Diagnosis not present

## 2022-02-20 NOTE — Addendum Note (Signed)
Addended by: Ricci Barker on: 02/20/2022 04:19 PM   Modules accepted: Orders

## 2022-04-03 ENCOUNTER — Telehealth: Payer: Self-pay | Admitting: Cardiovascular Disease

## 2022-04-03 NOTE — Telephone Encounter (Signed)
Pt c/o BP issue: STAT if pt c/o blurred vision, one-sided weakness or slurred speech ? ?1. What are your last 5 BP readings? 228/100; 213/110; 223/100 ? ?2. Are you having any other symptoms (ex. Dizziness, headache, blurred vision, passed out)? No  ? ?3. What is your BP issue? Very high blood pressure at work  ?

## 2022-04-03 NOTE — Telephone Encounter (Signed)
Spoke with pt regarding high blood pressure readings. Pt was at work when he went to health at work regarding issues with his right shoulder, at health at work his blood pressure was taken with results 211/110, 228/112. EMS was called by health at work but they never came. Pt was kept in the office and not able to go back to work blood pressure came down to 183/73 and then allowed to drive himself home. Pt states that his right shoulder is not extremely painful, but pt rates this a 6 out of 10. Pt states that he has episodes where his left arm feels weak.  Pt is not having an episode at this time. Pt states that his last episode was Friday, 2 weeks ago. Pt does say that he is taking him medication as prescribed. Pt instructed to make sure to he comes to office visit tomorrow with Coletta Memos, NP. Pt states he will come. Pt given ED precautions. Pt verbalizes understanding.  ?

## 2022-04-04 ENCOUNTER — Ambulatory Visit (INDEPENDENT_AMBULATORY_CARE_PROVIDER_SITE_OTHER): Payer: BC Managed Care – PPO | Admitting: General Practice

## 2022-04-04 ENCOUNTER — Emergency Department (HOSPITAL_COMMUNITY)
Admission: EM | Admit: 2022-04-04 | Discharge: 2022-04-04 | Disposition: A | Discharge status: Home / Self Care | Payer: BC Managed Care – PPO | Attending: Emergency Medicine | Admitting: Emergency Medicine

## 2022-04-04 ENCOUNTER — Emergency Department (HOSPITAL_COMMUNITY): Payer: BC Managed Care – PPO

## 2022-04-04 ENCOUNTER — Encounter: Payer: Self-pay | Admitting: General Practice

## 2022-04-04 VITALS — BP 202/100 | HR 94 | Ht 74.0 in | Wt 191.4 lb

## 2022-04-04 DIAGNOSIS — Z7982 Long term (current) use of aspirin: Secondary | ICD-10-CM | POA: Insufficient documentation

## 2022-04-04 DIAGNOSIS — Z79899 Other long term (current) drug therapy: Secondary | ICD-10-CM | POA: Insufficient documentation

## 2022-04-04 DIAGNOSIS — I10 Essential (primary) hypertension: Secondary | ICD-10-CM | POA: Insufficient documentation

## 2022-04-04 DIAGNOSIS — Z7901 Long term (current) use of anticoagulants: Secondary | ICD-10-CM | POA: Diagnosis not present

## 2022-04-04 DIAGNOSIS — I739 Peripheral vascular disease, unspecified: Secondary | ICD-10-CM

## 2022-04-04 DIAGNOSIS — I6523 Occlusion and stenosis of bilateral carotid arteries: Secondary | ICD-10-CM

## 2022-04-04 DIAGNOSIS — E785 Hyperlipidemia, unspecified: Secondary | ICD-10-CM

## 2022-04-04 DIAGNOSIS — Z720 Tobacco use: Secondary | ICD-10-CM

## 2022-04-04 DIAGNOSIS — E119 Type 2 diabetes mellitus without complications: Secondary | ICD-10-CM | POA: Diagnosis not present

## 2022-04-04 DIAGNOSIS — Z8551 Personal history of malignant neoplasm of bladder: Secondary | ICD-10-CM | POA: Diagnosis not present

## 2022-04-04 LAB — URINALYSIS, ROUTINE W REFLEX MICROSCOPIC
Bilirubin Urine: NEGATIVE
Glucose, UA: NEGATIVE mg/dL
Hgb urine dipstick: NEGATIVE
Ketones, ur: NEGATIVE mg/dL
Nitrite: NEGATIVE
Protein, ur: NEGATIVE mg/dL
Specific Gravity, Urine: 1.018 (ref 1.005–1.030)
pH: 5 (ref 5.0–8.0)

## 2022-04-04 LAB — BASIC METABOLIC PANEL
Anion gap: 9 (ref 5–15)
BUN: 19 mg/dL (ref 8–23)
CO2: 25 mmol/L (ref 22–32)
Calcium: 9.1 mg/dL (ref 8.9–10.3)
Chloride: 107 mmol/L (ref 98–111)
Creatinine, Ser: 1.03 mg/dL (ref 0.61–1.24)
GFR, Estimated: >60 mL/min (ref >60)
Glucose, Bld: 156 mg/dL — ABNORMAL HIGH (ref 70–99)
Potassium: 3.2 mmol/L — ABNORMAL LOW (ref 3.5–5.1)
Sodium: 141 mmol/L (ref 135–145)

## 2022-04-04 LAB — CBC
HCT: 38.5 % — ABNORMAL LOW (ref 39.0–52.0)
Hemoglobin: 13.1 g/dL (ref 13.0–17.0)
MCH: 30.4 pg (ref 26.0–34.0)
MCHC: 34 g/dL (ref 30.0–36.0)
MCV: 89.3 fL (ref 80.0–100.0)
Platelets: 257 10*3/uL (ref 150–400)
RBC: 4.31 MIL/uL (ref 4.22–5.81)
RDW: 12.4 % (ref 11.5–15.5)
WBC: 7.5 10*3/uL (ref 4.0–10.5)
nRBC: 0 % (ref 0.0–0.2)

## 2022-04-04 LAB — TROPONIN I (HIGH SENSITIVITY): Troponin I (High Sensitivity): 8 ng/L (ref <18)

## 2022-04-04 MED ORDER — NITROGLYCERIN 2 % TD OINT
1.0000 [in_us] | TOPICAL_OINTMENT | Freq: Once | TRANSDERMAL | Status: AC
  Administered 2022-04-04: 1 [in_us] via TOPICAL
  Filled 2022-04-04 (×2): qty 1

## 2022-04-04 MED ORDER — POTASSIUM CHLORIDE CRYS ER 20 MEQ PO TBCR
40.0000 meq | EXTENDED_RELEASE_TABLET | Freq: Once | ORAL | Status: AC
  Administered 2022-04-04: 40 meq via ORAL
  Filled 2022-04-04: qty 2

## 2022-04-04 MED ORDER — CLONIDINE HCL 0.1 MG PO TABS
0.1000 mg | ORAL_TABLET | Freq: Once | ORAL | Status: AC
Start: 2022-04-04 — End: 2022-04-04
  Administered 2022-04-04: 0.1 mg via ORAL
  Filled 2022-04-04: qty 1

## 2022-04-04 NOTE — ED Triage Notes (Signed)
Pt c/o hypertension w no symptoms. Was seen by cardiologist today as follow up appt, advised to come to ED for 818 systolic . Denies recent change in medications, brother passed approx 66moago. ?

## 2022-04-04 NOTE — Telephone Encounter (Signed)
04-04-22 at 930AM LM2CB ?------------------------------------------------ ? ?

## 2022-04-04 NOTE — Telephone Encounter (Signed)
04/04/2022 01:42:37 PM EDT-[id number] ?

## 2022-04-04 NOTE — Telephone Encounter (Signed)
Tried to call at work number, unable to reach ?

## 2022-04-04 NOTE — ED Provider Notes (Signed)
?[facility] ?Provider Note ? ? ?CSN: [id number] ?Arrival date & time: 04/04/22  1535 ? ?  ? ?History ? ?Chief Complaint  ?Patient presents with  ? Hypertension  ? ? ?Gabriel [name] is a 66 y.o. male. ? ? ?Hypertension ?Pertinent negatives include no chest pain, no headaches and no shortness of breath. Patient presents for asymptomatic hypertension.  His medical history includes DM, HTN, PAD, and bladder cancer.  For his hypertension, he takes clonidine, losartan, amlodipine, and HCTZ.  He denies any recent dose changes.  He has been taking these as prescribed.  He typically does not check his blood pressure.  He was recently seen for evaluation of orthopedic shoulder injury.  At that time, he was noted to have elevated blood pressure in the range of 190s.  He states that this was approximately 1 week ago.  He was seen by the cardiologist today and blood pressure was elevated greater than 762 systolic.  He denies any recent symptoms of chest pain, shortness of breath, headache, dizziness, areas of numbness or weakness, vision changes, or nausea.  Due to his elevated blood pressure reading at cardiology office, he was advised to come to the ED.   ? ?  ? ?Home Medications ?Prior to Admission medications   ?Medication Sig Start Date End Date Taking? Authorizing Provider  ?aspirin 81 MG EC tablet Take 81 mg by mouth daily.    [provider]  ?atorvastatin (LIPITOR) 20 MG tablet Take 1 tablet (20 mg total) by mouth daily. ?Patient not taking: Reported on 04/04/2022 01/17/22 04/17/22  Wellington Hampshire, MD  ?cloNIDine (CATAPRES) 0.1 MG tablet Take 0.1 mg by mouth 2 (two) times daily. 01/17/17   [provider]  ?clopidogrel (PLAVIX) 75 MG tablet Take 1 tablet (75 mg total) by mouth daily. 01/25/22 01/25/23  Wellington Hampshire, MD  ?glyBURIDE-metformin (GLUCOVANCE) 2.5-500 MG tablet Take 1 tablet by mouth 2 (two) times daily. 05/27/21   [provider]   ?Olmesartan-amLODIPine-HCTZ 40-10-25 MG TABS Take 1 tablet by mouth daily. 06/23/21   [provider]  ?   ? ?Allergies    ?Patient has no known allergies.   ? ?Review of Systems   ?Review of Systems  ?Eyes:  Negative for visual disturbance.  ?Respiratory:  Negative for shortness of breath.   ?Cardiovascular:  Negative for chest pain.  ?Gastrointestinal:  Negative for nausea.  ?Neurological:  Negative for dizziness, speech difficulty, weakness, numbness and headaches.  ?All other systems reviewed and are negative. ? ?Physical Exam ?Updated Vital Signs ?BP (!) 169/81   Pulse 64   Temp 98.1 ?F (36.7 ?C)   Resp 16   SpO2 100%  ?Physical Exam ?Vitals and nursing note reviewed.  ?Constitutional:   ?   General: He is not in acute distress. ?   Appearance: Normal appearance. He is well-developed and normal weight. He is not ill-appearing, toxic-appearing or diaphoretic.  ?HENT:  ?   Head: Normocephalic and atraumatic.  ?   Right Ear: External ear normal.  ?   Left Ear: External ear normal.  ?   Nose: Nose normal.  ?   Mouth/Throat:  ?   Mouth: Mucous membranes are moist.  ?Eyes:  ?   General: No scleral icterus. ?   Extraocular Movements: Extraocular movements intact.  ?   Conjunctiva/sclera: Conjunctivae normal.  ?Cardiovascular:  ?   Rate and Rhythm: Normal rate and regular rhythm.  ?   Heart sounds: No murmur heard. ?  Pulmonary:  ?   Effort: Pulmonary effort is normal. No respiratory distress.  ?   Breath sounds: Normal breath sounds. No wheezing or rales.  ?Abdominal:  ?   Palpations: Abdomen is soft.  ?   Tenderness: There is no abdominal tenderness.  ?Musculoskeletal:     ?   General: No swelling. Normal range of motion.  ?   Cervical back: Normal range of motion and neck supple. No rigidity or tenderness.  ?   Right lower leg: No edema.  ?   Left lower leg: No edema.  ?Skin: ?   General: Skin is warm and dry.  ?   Coloration: Skin is not jaundiced or pale.  ?Neurological:  ?   General: No focal deficit  present.  ?   Mental Status: He is alert and oriented to person, place, and time.  ?   Cranial Nerves: No cranial nerve deficit.  ?   Sensory: No sensory deficit.  ?   Motor: No weakness.  ?   Coordination: Coordination normal.  ?Psychiatric:     ?   Mood and Affect: Mood normal.     ?   Behavior: Behavior normal.     ?   Thought Content: Thought content normal.     ?   Judgment: Judgment normal.  ? ? ?ED Results / Procedures / Treatments   ?Labs ?(all labs ordered are listed, but only abnormal results are displayed) ?Labs Reviewed  ?URINALYSIS, ROUTINE W REFLEX MICROSCOPIC - Abnormal; Notable for the following components:  ?    Result Value  ? APPearance CLOUDY (*)   ? Leukocytes,Ua TRACE (*)   ? Bacteria, UA RARE (*)   ? All other components within normal limits  ?CBC - Abnormal; Notable for the following components:  ? HCT 38.5 (*)   ? All other components within normal limits  ?BASIC METABOLIC PANEL - Abnormal; Notable for the following components:  ? Potassium 3.2 (*)   ? Glucose, Bld 156 (*)   ? All other components within normal limits  ?TROPONIN I (HIGH SENSITIVITY)  ? ? ?EKG ?None ? ?Radiology ?DG Chest 1 View ? ?Result Date: 04/04/2022 ?CLINICAL DATA:  Hypertension with no symptoms. EXAM: CHEST  1 VIEW COMPARISON:  11/24/2015 FINDINGS: The heart is normal in size.The cardiomediastinal contours are normal. The lungs are clear. Pulmonary vasculature is normal. No consolidation, pleural effusion, or pneumothorax. No acute osseous abnormalities are seen. IMPRESSION: No acute chest findings. Electronically Signed   By: Keith Rake M.D.   On: 04/04/2022 18:04   ? ?Procedures ?Procedures  ? ? ?Medications Ordered in ED ?Medications  ?nitroGLYCERIN (NITROGLYN) 2 % ointment 1 inch (1 inch Topical Given 04/04/22 1923)  ?cloNIDine (CATAPRES) tablet 0.1 mg (0.1 mg Oral Given 04/04/22 1923)  ?potassium chloride SA (KLOR-CON M) CR tablet 40 mEq (40 mEq Oral Given 04/04/22 1923)  ? ? ?ED Course/ Medical Decision Making/  A&P ?  ?                        ?Medical Decision Making ?Amount and/or Complexity of Data Reviewed ?Radiology: ordered. ? ?Risk ?Prescription drug management. ? ? ?Patient presents to the ED for asymptomatic hypertension.  He was advised to do this by provider at his cardiology office.  Although he does not frequently check his blood pressure, he did notice that it was in the 190s 1 week ago.  Today was above 200 SBP.  He has been taking  his home antihypertensive medications and has not had any recent dose changes.  On arrival, blood pressure elevated in the range of 200s over 80s.  Patient remains asymptomatic.  He is well-appearing on exam.  Work-up was initiated to identify possible endorgan damage.  I spoke with radiology regarding possible renal artery ultrasound.  Patient would need to be n.p.o. for this and this is typically an exam that is done on an outpatient basis.  Remaining diagnostic work-up was reassuring.  Patient's EKG was consistent with prior EKG from February.  There is no evidence of endorgan damage on lab work or chest x-ray.  Given no neurologic symptoms, I do not feel that he needs a CT head.  Lab work did show slightly low potassium and this was replaced in the ED.  He was given extra dose of his home clonidine and blood pressure improved.  He was advised that he likely needs changes in his dosing of his home blood pressure medicines.  Currently, he is on low-dose clonidine.  He was advised that he may increase this dose to 0.2 mg, as long as he is able to check his blood pressure at home to ensure that blood pressure does not get excessively low.  He was advised to follow-up with his primary care doctor and his cardiologist to discuss home medications in order to maintain normal blood pressures and avoid long-term organ damage.  He was discharged in good condition. ? ? ? ? ? ? ? ?Final Clinical Impression(s) / ED Diagnoses ?Final diagnoses:  ?Hypertension, unspecified type  ? ? ?Rx / DC  Orders ?ED Discharge Orders   ? ? None  ? ?  ? ? ?  ?Godfrey Pick, MD ?04/05/22 0024 ? ?

## 2022-04-04 NOTE — Patient Instructions (Signed)
Special Instructions ? ?GO DIRECTLY TO THE ER ? ?Follow-Up: ?Your next appointment:  on 04-18-2022 at   220PM  In Person with  Coletta Memos, [place]  1 ? ?At [facility], you and your health needs are our priority.  As part of our continuing mission to provide you with exceptional heart care, we have created designated Provider Care Teams.  These Care Teams include your primary Cardiologist (physician) and Advanced Practice Providers (APPs -  Physician Assistants and Nurse Practitioners) who all work together to provide you with the care you need, when you need it. ? ?We recommend signing up for the patient portal called "MyChart".  Sign up information is provided on this After Visit Summary.  MyChart is used to connect with patients for Virtual Visits (Telemedicine).  Patients are able to view lab/test results, encounter notes, upcoming appointments, etc.  Non-urgent messages can be sent to your provider as well.   ?To learn more about what you can do with MyChart, go to [email or web address].   ? ? ?Important Information About Sugar ? ? ? ? ? ? ?        ?

## 2022-04-04 NOTE — Progress Notes (Signed)
? ?Cardiology Clinic Note  ? ?Patient Name: Gabriel [name] ?Date of Encounter: 04/04/2022 ? ?Primary Care Provider:  Bernerd Limbo, MD ?Primary Cardiologist:  None ? ?Patient Profile  ?  ? Gabriel [name] 66 year old male presents to the clinic today for evaluation of his hypertension. ? ?Past Medical History  ?  ?Past Medical History:  ?Diagnosis Date  ? Bladder cancer ([place])   ? Diabetes mellitus without complication ([place]) 4825  ? borderline- not taking prescribed med  ? Headache(784.0) 1990's  ? not current problem  ? Hypertension   ? ?Past Surgical History:  ?Procedure Laterality Date  ? ABDOMINAL AORTOGRAM W/LOWER EXTREMITY N/A 01/25/2022  ? Procedure: ABDOMINAL AORTOGRAM W/LOWER EXTREMITY;  Surgeon: Wellington Hampshire, MD;  Location: [place] [facility];  Service: Cardiovascular;  Laterality: N/A;  ? CYSTOSCOPY W/ URETERAL STENT PLACEMENT Bilateral 09/12/2013  ? Procedure: CYSTOSCOPY WITH BILATERAL  RETROGRADE PYELOGRAM  ;  Surgeon: Alexis Frock, MD;  Location: [facility];  Service: Urology;  Laterality: Bilateral;  ? KNEE ARTHROSCOPY Right many years ago-  ? doesn't remember date  ? NASAL POLYP SURGERY  date unknown  ? PERIPHERAL VASCULAR ATHERECTOMY Right 01/25/2022  ? Procedure: PERIPHERAL VASCULAR ATHERECTOMY;  Surgeon: Wellington Hampshire, MD;  Location: [place] [facility];  Service: Cardiovascular;  Laterality: Right;  ? RECTAL SURGERY  date unknown  ? TRANSURETHRAL RESECTION OF BLADDER TUMOR WITH GYRUS (TURBT-GYRUS) N/A 09/12/2013  ? Procedure: TRANSURETHRAL RESECTION OF BLADDER TUMOR WITH GYRUS (TURBT-GYRUS);  Surgeon: Alexis Frock, MD;  Location: [facility];  Service: Urology;  Laterality: N/A;  1 HR ?  ? ? ?Allergies ? ?No Known Allergies ? ?History of Present Illness  ?  ? [name] has a PMH of diabetes, headache, hypertension, PAD, carotid bruit, and bladder cancer.  He also has a family history of coronary artery disease.  He was previously followed by cardiology at  [facility] and was seen for atypical chest pain.  He underwent nuclear stress testing which showed no evidence of ischemia.  He was referred by his PCP for evaluation of his peripheral arterial disease after complaining of severe right calf claudication and numbness in bilateral feet.  His ABIs showed right 0.63 and left 0.88.  Lower extremity duplex showed severe stenosis of his right distal SFA and moderate left SFA.  He was seen by Dr. Mariea Clonts at 01/17/2022.  His carotid Doppler showed 1-39% stenosis in his left and 40-59% stenosis in his right ICA.  A repeat study was recommended in 12 months.  His lipid panel showed an LDL cholesterol of 99.  Tobacco cessation was recommended.  Atorvastatin 20 mg was also added to his medication regimen.  He underwent lower extremity angiography 01/25/2022 which showed no significant aortoiliac disease but did show severe subocclusive disease in his distal SFA with two-vessel runoff below the knee.  He was also noted to have severe proximal disease in his anterior and posterior tibial artery.  He underwent successful directional arthrectomy and drug coated balloon angioplasty to his right SFA.  Postprocedure he was placed on aspirin and clopidogrel. ? ?He followed up with Almyra Deforest PA-C on 02/15/2022.  He reported that his right lower extremity symptoms had resolved.  He continued to have numbness and tingling on the lateral side of his right foot.  The degree of numbness did not change with ambulation and was constant.  It was felt that his numbness was not a result of his arterial blockage.  He  denied painful ambulation.  Follow-up fasting lipids we will plan for 3-4 weeks.  Tobacco cessation was again discussed as well as healthy diet.  Follow-up  was planned for 3 months with Dr. Fletcher Anon. ? ?He presents to the clinic today for follow-up evaluation and states over the last 2 days he has noticed that his blood pressure has been high.  He reports that at home his blood pressures have been in  the 228- 180s over 100-120s.  He reports compliance with his clonidine and olmesartan/amlodipine/hydrochlorothiazide medication.  He denies headaches, dizziness, and is neurologically intact.  He denies changes to his diet.  He reports he occasionally has beer on weekends.  He drinks 2 large coffees daily.  He does note some right shoulder pain which she is planning to see orthopedic specialist for.  He has not yet received treatment for his right shoulder.  It has been affecting his sleep.  He smokes a cigar daily.  He has also noted some recent sinus drainage but has not been taking any decongestant type medication.  I reviewed the risks of CVA and MI.  I recommend renal doppler studies.  Case discussed with DOD.  Patient had left clinic before I return to provide further instructions. He was instructed to present to the emergency department for further evaluation and treatment. ? ?Today he denies chest pain, shortness of breath, lower extremity edema, fatigue, palpitations, melena, hematuria, hemoptysis, diaphoresis, weakness, presyncope, syncope, orthopnea, and PND. ? ? ?Home Medications  ?  ?Prior to Admission medications   ?Medication Sig Start Date End Date Taking? Authorizing Provider  ?aspirin 81 MG EC tablet Take 81 mg by mouth daily.    [provider]  ?atorvastatin (LIPITOR) 20 MG tablet Take 1 tablet (20 mg total) by mouth daily. 01/17/22 04/17/22  Wellington Hampshire, MD  ?cloNIDine (CATAPRES) 0.1 MG tablet Take 0.1 mg by mouth 2 (two) times daily. 01/17/17   [provider]  ?clopidogrel (PLAVIX) 75 MG tablet Take 1 tablet (75 mg total) by mouth daily. 01/25/22 01/25/23  Wellington Hampshire, MD  ?glyBURIDE-metformin (GLUCOVANCE) 2.5-500 MG tablet Take 1 tablet by mouth 2 (two) times daily. 05/27/21   [provider]  ?Olmesartan-amLODIPine-HCTZ 40-10-25 MG TABS Take 1 tablet by mouth daily. 06/23/21   [provider]  ? ? ?Family History  ?  ?History reviewed. No pertinent  family history. ?has no family status information on file.  ? ?Social History  ?  ?Social History  ? ?Socioeconomic History  ? Marital status: Married  ?  Spouse name: Not on file  ? Number of children: Not on file  ? Years of education: Not on file  ? Highest education level: Not on file  ?Occupational History  ? Not on file  ?Tobacco Use  ? Smoking status: Former  ?  Types: Cigarettes  ?  Quit date: 09/08/1990  ?  Years since quitting: 31.5  ? Smokeless tobacco: Not on file  ?Substance and Sexual Activity  ? Alcohol use: No  ? Drug use: No  ? Sexual activity: Not on file  ?Other Topics Concern  ? Not on file  ?Social History Narrative  ? Not on file  ? ?Social Determinants of Health  ? ?Financial Resource Strain: Not on file  ?Food Insecurity: Not on file  ?Transportation Needs: Not on file  ?Physical Activity: Not on file  ?Stress: Not on file  ?Social Connections: Not on file  ?Intimate Partner Violence: Not on file  ?  ? ?  Review of Systems  ?  ?General:  No chills, fever, night sweats or weight changes.  ?Cardiovascular:  No chest pain, dyspnea on exertion, edema, orthopnea, palpitations, paroxysmal nocturnal dyspnea. ?Dermatological: No rash, lesions/masses ?Respiratory: No cough, dyspnea ?Urologic: No hematuria, dysuria ?Abdominal:   No nausea, vomiting, diarrhea, bright red blood per rectum, melena, or hematemesis ?Neurologic:  No visual changes, wkns, changes in mental status. ?All other systems reviewed and are otherwise negative except as noted above. ? ?Physical Exam  ?  ?VS:  BP (!) 202/100   Pulse 94   Ht '6\' 2"'$  (1.88 m)   Wt 191 lb 6.4 oz (86.8 kg)   SpO2 98%   BMI 24.57 kg/m?  , BMI Body mass index is 24.57 kg/m?. ?GEN: Well nourished, well developed, in no acute distress. ?HEENT: normal. ?Neck: Supple, no JVD, carotid bruits, or masses. ?Cardiac: RRR, no murmurs, rubs, or gallops. No clubbing, cyanosis, edema.  Radials/DP/PT 2+ and equal bilaterally.  ?Respiratory:  Respirations regular and  unlabored, clear to auscultation bilaterally. ?GI: Soft, nontender, nondistended, BS + x 4. ?MS: no deformity or atrophy. ?Skin: warm and dry, no rash. ?Neuro:  Strength and sensation are intact. ?Psych: Normal affect.

## 2022-04-04 NOTE — Telephone Encounter (Signed)
OV with Coletta Memos today 4/25 ?

## 2022-04-04 NOTE — Discharge Instructions (Signed)
Continue to take your home blood pressure medications.  If you continue to have elevated blood pressures at home, you can increase your clonidine dose from 0.1 mg twice a day to 0.2 mg twice a day.  Discuss blood pressure medications with your primary care doctor and your cardiologist.  If you develop any new symptoms of concern, please return to the emergency department. ?

## 2022-04-04 NOTE — ED Provider Triage Note (Signed)
Emergency Medicine Provider Triage Evaluation Note ? ?Gabriel [name] , a 66 y.o. male  was evaluated in triage.  Pt complains of hypertension.  Patient states that he was seen at his cardiologist today, advised that he had hypertension and sent to ED for further management.  Patient denies any chest pain, shortness of breath, blurred vision, headache, back pain.  Patient reports compliance on her blood pressure medication.  On chart review, it appears that this patient might been sent here for renal artery duplex ultrasound.  Patient also endorsing right-sided shoulder pain however he states that he has been seen by orthopedist for this and they have plans for outpatient MRI study. ? ?Review of Systems  ?Positive:  ?Negative:  ? ?Physical Exam  ?BP (!) 206/98 (BP Location: Left Arm)   Pulse 76   Temp 98.2 ?F (36.8 ?C) (Oral)   Resp 16   SpO2 99%  ?Gen:   Awake, no distress   ?Resp:  Normal effort  ?MSK:   Moves extremities without difficulty  ?Other:  No focal neurodeficits on examination. ? ?Medical Decision Making  ?Medically screening exam initiated at 4:04 PM.  Appropriate orders placed.  Gabriel [name] was informed that the remainder of the evaluation will be completed by another provider, this initial triage assessment does not replace that evaluation, and the importance of remaining in the ED until their evaluation is complete. ? ? ?  ?Azucena Cecil, PA-C ?04/04/22 1605 ? ?

## 2022-04-04 NOTE — Telephone Encounter (Signed)
04/04/2022 08:16 AM LM2CB ? ? ?

## 2022-04-04 NOTE — Telephone Encounter (Signed)
Just FYI--Patient returned call, but he is at the office. Just got called to the back. ?

## 2022-04-05 ENCOUNTER — Telehealth: Payer: Self-pay | Admitting: General Practice

## 2022-04-05 NOTE — Telephone Encounter (Signed)
Called the patient to check on him. He had been having elevated blood pressures for the past month. He has been asymptomatic. He was set up with an appointment with APP yesterday and was advised to go to the ED for an elevated blood pressure. ? ?At the ED, labs and tests came back normal.  ?Currently, he is on low-dose clonidine.  He was advised that he may increase this dose to 0.2 mg, as long as he is able to check his blood pressure at home to ensure that blood pressure does not get excessively low.  He was advised to follow-up with his primary care doctor and his cardiologist to discuss home medications in order to maintain normal blood pressures and avoid long-term organ damage ? ?The patient stated that when the medications were reviewed at the ED, they mentioned the Olmesartan amlodipine-hctz. He ran out over a month ago and was never given a refill. He does not remember who prescribed the medication. He feels like this correlates with the high blood pressure.  ? ?He cannot check his blood pressure at home. He has been advised it would be useful to get a blood pressure cuff for home. He will get his blood pressure checked and call back if it is still elevated.  ? ?He was currently asymptomatic. Appointment made with Dr. Fletcher Anon on 04/11/22 ? ? ?

## 2022-04-05 NOTE — Telephone Encounter (Signed)
His recent angiography did not show evidence of renal artery stenosis.  Make sure he got refills on olmesartan-amlodipine-hydrochlorothiazide.  Follow-up with me as planned next week. ?

## 2022-04-05 NOTE — Telephone Encounter (Signed)
Patient walked in and wanted to request a Doctors note to take Thursday off . Patient was seen 0425/2023 and went to ER/ patient was not able to get note from ER.  ? ?Please reach out to patient . ? ?Per triage patient was asked ti return tomorrow as Provider not in the office today.  ?

## 2022-04-06 MED ORDER — OLMESARTAN-AMLODIPINE-HCTZ 40-10-25 MG PO TABS
1.0000 | ORAL_TABLET | Freq: Every day | ORAL | 2 refills | Status: DC
Start: 2022-04-06 — End: 2023-03-27

## 2022-04-06 NOTE — Telephone Encounter (Signed)
Called spoke to patient .  ? Patient states he needs a note  to return to work . ? Patient was at office 04/04/22 was sent to ER same day. ?Per patient ,  left around 10 am that evening from  ER. ? Patient bought blood pressure cuff. ? He has been checking  his blood  pressure ? Starting  Wednesday  @ 6 pm   201/? ? 04/05/22  10:09 pm  181/97 ?04/05/22 10:11 pm  181/104 ? ?04/06/22 7:10 am    204/114 ? 7:12 am     204/108 ?  8:38 am     182/103 ? 9:00 am     176/109 ? Patient  would like to return to work on Friday April 07, 2022 ? He requires a  note. ?  ?Patient states he did not receive any new medication from ER visit ? ?RN  went over medication list again with patient- patient states she is taking  ? ?Clonidine 0.1 mg twice a day  ?Glyburide- metformin 2.5 mg - 500 mg  twice a day  ?Atorvastatin  20 mg daily  ?Clopidogrel 75 mg daily  ?Aspirin 81 mg  daily ? ? ? RN informed patient Dr Fletcher Anon would like for patient to  restart taking Olmesartan /amlodipine/hctz ( 40/10/25 mg)  combination tablet. Medication was sent to pharmacy. RN informed patient to call pharmacy to see if medication is available to him if not the combination of the three medication can be split to give 3 individual doses. RN informed patient  will discuss with provider  who he saw on Tuesday 04/04/22 about the note.  And contact him back. Patient verbalized understanding. ? ?

## 2022-04-06 NOTE — Telephone Encounter (Signed)
PT WALKED IN TO GET WORK NOTE AND PROVIDED BP/HR LIST FOR JESSE. NOTIFIED THAT WE CANNOT PROVIDE WORK NOTE AND TO GO TO THE ER TO HAVE THEM PROVIDE THIS NOTE. HE ASKS THAT DR ARIDA'S NURSE GIVE HIM A CALL. ?

## 2022-04-06 NOTE — Telephone Encounter (Signed)
? ?  Pt is calling back to f/u. He also wanted to f/u the work not. He asked if he can pick it up today ?

## 2022-04-06 NOTE — Addendum Note (Signed)
Addended by: Raiford Simmonds on: 04/06/2022 11:16 AM ? ? Modules accepted: Orders ? ?

## 2022-04-06 NOTE — Telephone Encounter (Signed)
Called patient back ?Patient aware he can pick note to return to work. ?Patient states  the medication -Olmesartan-Amlodipine-HCTZ was available for pick up. He states he will pick it up. ? ? Patient aware to bring  blood pressure readings, b/p cuff , and medication to upcoming appointment. ?  ( note placed at front desk  along with several sheets  of paper to keep reading in one central place.)  RN informed patient to only check blood pressure 1  to 3 times a a day no more than that. ? ? ?

## 2022-04-06 NOTE — Telephone Encounter (Signed)
S/W JC he states that we will not be providing work note for pt. How is pt's BP running? Did he increase clonidine to 0.'2mg'$  BID? LM2CB ?

## 2022-04-07 NOTE — Telephone Encounter (Signed)
Patient received a work excuse note. See other epic encounter.  ?

## 2022-04-11 ENCOUNTER — Encounter: Payer: Self-pay | Admitting: Cardiovascular Disease

## 2022-04-11 ENCOUNTER — Ambulatory Visit (INDEPENDENT_AMBULATORY_CARE_PROVIDER_SITE_OTHER): Payer: BC Managed Care – PPO | Admitting: Cardiovascular Disease

## 2022-04-11 VITALS — BP 167/91 | HR 75 | Ht 74.0 in | Wt 191.8 lb

## 2022-04-11 DIAGNOSIS — R0989 Other specified symptoms and signs involving the circulatory and respiratory systems: Secondary | ICD-10-CM | POA: Diagnosis not present

## 2022-04-11 DIAGNOSIS — I10 Essential (primary) hypertension: Secondary | ICD-10-CM

## 2022-04-11 DIAGNOSIS — G4730 Sleep apnea, unspecified: Secondary | ICD-10-CM

## 2022-04-11 DIAGNOSIS — I739 Peripheral vascular disease, unspecified: Secondary | ICD-10-CM

## 2022-04-11 DIAGNOSIS — E785 Hyperlipidemia, unspecified: Secondary | ICD-10-CM | POA: Diagnosis not present

## 2022-04-11 DIAGNOSIS — R0683 Snoring: Secondary | ICD-10-CM

## 2022-04-11 MED ORDER — SPIRONOLACTONE 25 MG PO TABS: 25.0000 mg | ORAL_TABLET | Freq: Every day | ORAL | 1 refills | Status: DC

## 2022-04-11 NOTE — Patient Instructions (Signed)
Medication Instructions:  ?STOP the Clonidine ? ?START Spironolactone 25 mg once daily ? ?*If you need a refill on your cardiac medications before your next appointment, please call your pharmacy* ? ? ?Lab Work: ?Your provider would like for you to return in one week to have the following labs drawn: BMET. You do not need an appointment for the lab. Once in our office lobby there is a podium where you can sign in and ring the doorbell to alert [place] that you are here. The lab is open from 8:00 am to 4 pm; closed for lunch from 12:45pm-1:45pm. ? ?You may also go to any of these [facility] locations: ?  ?[place] ?- [place] ([facility]) ?- [address] ?- [place]  ?If you have labs (blood work) drawn today and your tests are completely normal, you will receive your results only by: ?MyChart Message (if you have MyChart) OR ?A paper copy in the mail ?If you have any lab test that is abnormal or we need to change your treatment, we will call you to review the results. ? ? ?Testing/Procedures: ?Your physician has recommended that you have a home sleep study. This test records several body functions during sleep, including: brain activity, eye movement, oxygen and carbon dioxide blood levels, heart rate and rhythm, breathing rate and rhythm, the flow of air through your mouth and nose, snoring, body muscle movements, and chest and belly movement. ? ? ?Follow-Up: ?At [facility], you and your health needs are our priority.  As part of our continuing mission to provide you with exceptional heart care, we have created designated Provider Care Teams.  These Care Teams include your primary Cardiologist (physician) and Advanced Practice Providers (APPs -  Physician Assistants and Nurse Practitioners) who all work together to provide you with the care you need, when you need it. ? ?We recommend signing up for the patient portal called "MyChart".  Sign up  information is provided on this After Visit Summary.  MyChart is used to connect with patients for Virtual Visits (Telemedicine).  Patients are able to view lab/test results, encounter notes, upcoming appointments, etc.  Non-urgent messages can be sent to your provider as well.   ?To learn more about what you can do with MyChart, go to [email or web address].   ? ?Your next appointment:   ?3 month(s) ? ?The format for your next appointment:   ?In Person ? ?Provider:   ?Dr. Fletcher Anon ? ?Other Instructions ?EXERCISE PROGRAM FOR INDIVIDUALS WITH  ?PERIPHERAL ARTERIAL DISEASE (PAD)  ? ?General Information:  ? ?Research in vascular exercise has demonstrated remarkable improvement in symptoms of leg pain (claudication) without expensive or invasive interventions. Regular walking programs are extremely helpful for patients with PAD and intermittent claudication.  ?These steps are designed to help you get started with a safe and effective program to help you walk farther with less pain:  ? Walk at least three times a week (preferably every day). ? Your goal is to build up to 30-45 minutes of total walking time (not counting rest breaks). It may take you several weeks to build up your exercise time starting at 5-10 minutes or whatever you can tolerate. ? Walk as far as possible using moderate to maximal pain (7-8 on the scale below) as a signal to stop, and resume walking when the pain goes away. ? On a treadmill, set the speed and grade at a level that brings on  the claudication pain within 3 to 5 minutes. Walk at this rate until you experience claudication of moderate severity, rest until the pain improves, and then resume walking. ? Over time, you will be able to walk longer at the designated speed and grade; workload should then be increased until you develop the pain within 3 to 5 minutes once again. ? This regimen will induce a significant benefit. Studies have demonstrated that participants may be able to walk up to  three or four times farther and have less leg pain, within twelve weeks, by following this protocol. ? ?Pain Scale  ? ? 0_____1_____2_____3_____4_____5_____6_____7_____8_____9_____10  ? No Pain                                   Moderate Pain                               Maximal Pain ? ? ? ? ?

## 2022-04-11 NOTE — Progress Notes (Signed)
?  ?Cardiology Office Note ? ? ?Date:  04/11/2022  ? ?ID:  Gabriel [name], DOB [birth year], MRN [id number] ? ?PCP:  Bernerd Limbo, MD  ?Cardiologist:   [name], MD  ? ?Chief Complaint  ?Patient presents with  ? Follow-up  ? ? ?  ?History of Present Illness: ?Gabriel [name] is a 66 y.o. male who is here today for follow-up visit regarding peripheral arterial disease and uncontrolled hypertension.   ?He has no previous cardiac history.  He has known history of essential hypertension, borderline diabetes and previous bladder cancer.  He smokes cigars daily.  He reports family history of coronary artery disease. ?He was seen by [facility] cardiology last year for atypical chest pain.  He underwent a treadmill nuclear stress test which showed no evidence of ischemia. ?He was seen recently for severe right calf claudication and numbness in both feet. ? ?He underwent noninvasive vascular studies which showed an ABI of 0.63 on the right and 0.88 on the left.  Duplex showed severe stenosis in the right distal SFA and moderate left SFA disease.   ? ?I proceeded with angiography which showed no significant renal artery stenosis, no significant aortoiliac disease and severe subtotal occlusion of the right SFA with significant below the knee disease.  I performed directional arthrectomy and drug-coated balloon angioplasty to the right SFA.  Right calf claudication improved significantly.  He still has some mild numbness in the right foot.  He does complain of left calf claudication.  Postprocedure ABI improved on the right side. ?He is known to have elevated blood pressure.  He recently ran out of olmesartan/amlodipine/hydrochlorothiazide and blood pressure was extremely high.  He was seen in our office and was sent to the ED.  He was placed on clonidine 0.1 mg twice daily.  He reports getting sleepy when he takes the medication.  He is not aware of sleep apnea but does snore loudly at home and feels very tired and fatigued  especially in the afternoon.  He denies NSAID use. ? ?Past Medical History:  ?Diagnosis Date  ? Bladder cancer ([place])   ? Diabetes mellitus without complication ([place]) 3785  ? borderline- not taking prescribed med  ? Headache(784.0) 1990's  ? not current problem  ? Hypertension   ? ? ?Past Surgical History:  ?Procedure Laterality Date  ? ABDOMINAL AORTOGRAM W/LOWER EXTREMITY N/A 01/25/2022  ? Procedure: ABDOMINAL AORTOGRAM W/LOWER EXTREMITY;  Surgeon: Wellington Hampshire, MD;  Location: [place] [facility];  Service: Cardiovascular;  Laterality: N/A;  ? CYSTOSCOPY W/ URETERAL STENT PLACEMENT Bilateral 09/12/2013  ? Procedure: CYSTOSCOPY WITH BILATERAL  RETROGRADE PYELOGRAM  ;  Surgeon: Alexis Frock, MD;  Location: [facility];  Service: Urology;  Laterality: Bilateral;  ? KNEE ARTHROSCOPY Right many years ago-  ? doesn't remember date  ? NASAL POLYP SURGERY  date unknown  ? PERIPHERAL VASCULAR ATHERECTOMY Right 01/25/2022  ? Procedure: PERIPHERAL VASCULAR ATHERECTOMY;  Surgeon: Wellington Hampshire, MD;  Location: [place] [facility];  Service: Cardiovascular;  Laterality: Right;  ? RECTAL SURGERY  date unknown  ? TRANSURETHRAL RESECTION OF BLADDER TUMOR WITH GYRUS (TURBT-GYRUS) N/A 09/12/2013  ? Procedure: TRANSURETHRAL RESECTION OF BLADDER TUMOR WITH GYRUS (TURBT-GYRUS);  Surgeon: Alexis Frock, MD;  Location: [facility];  Service: Urology;  Laterality: N/A;  1 HR ?  ? ? ? ?Current Outpatient Medications  ?Medication Sig Dispense Refill  ? aspirin 81 MG EC tablet Take 81 mg by mouth daily.    ?  atorvastatin (LIPITOR) 20 MG tablet Take 1 tablet (20 mg total) by mouth daily. 90 tablet 3  ? cloNIDine (CATAPRES) 0.1 MG tablet Take 0.1 mg by mouth 2 (two) times daily.    ? clopidogrel (PLAVIX) 75 MG tablet Take 1 tablet (75 mg total) by mouth daily. 30 tablet 6  ? glyBURIDE-metformin (GLUCOVANCE) 2.5-500 MG tablet Take 1 tablet by mouth 2 (two) times daily.    ? Olmesartan-amLODIPine-HCTZ 40-10-25  MG TABS Take 1 tablet by mouth daily. 90 tablet 2  ? ?No current facility-administered medications for this visit.  ? ? ?Allergies:   Patient has no known allergies.  ? ? ?Social History:  The patient  reports that he quit smoking about 31 years ago. His smoking use included cigarettes. He does not have any smokeless tobacco history on file. He reports that he does not drink alcohol and does not use drugs.  ? ?Family History:  [name] family history is remarkable for coronary artery disease and hypertension. ? ? ?ROS:  Please see the history of present illness.   Otherwise, review of systems are positive for none.   All other systems are reviewed and negative.  ? ? ?PHYSICAL EXAM: ?VS:  BP (!) 167/91 (BP Location: Right Arm, Patient Position: Sitting, Cuff Size: Normal) Comment: Patient's automatic BP machine  Pulse 75   Ht '6\' 2"'$  (1.88 m)   Wt 191 lb 12.8 oz (87 kg)   SpO2 99%   BMI 24.63 kg/m?  , BMI Body mass index is 24.63 kg/m?. ?GEN: Well nourished, well developed, in no acute distress  ?HEENT: normal  ?Neck: no JVD,  or masses.  Right carotid bruit. ?Cardiac: RRR; no murmurs, rubs, or gallops,no edema  ?Respiratory:  clear to auscultation bilaterally, normal work of breathing ?GI: soft, nontender, nondistended, + BS ?MS: no deformity or atrophy  ?Skin: warm and dry, no rash ?Neuro:  Strength and sensation are intact ?Psych: euthymic mood, full affect ?Vascular: Femoral pulses are normal bilaterally.  ? ? ?EKG:  EKG is not ordered today. ? ? ? ?Recent Labs: ?01/17/2022: ALT 16 ?04/04/2022: BUN 19; Creatinine, Ser 1.03; Hemoglobin 13.1; Platelets 257; Potassium 3.2; Sodium 141  ? ? ?Lipid Panel ?   ?Component Value Date/Time  ? CHOL 198 01/17/2022 1138  ? TRIG 379 (H) 01/17/2022 1138  ? HDL 35 (L) 01/17/2022 1138  ? CHOLHDL 5.7 (H) 01/17/2022 1138  ? [place] 99 01/17/2022 1138  ? ?  ? ?Wt Readings from Last 3 Encounters:  ?04/11/22 191 lb 12.8 oz (87 kg)  ?04/04/22 191 lb 6.4 oz (86.8 kg)  ?02/15/22 188  lb 9.6 oz (85.5 kg)  ?  ? ? ? ?   ? View : No data to display.  ?  ?  ?  ? ? ? ? ?ASSESSMENT AND PLAN: ? ?1.  Peripheral arterial disease: Status post right SFA directional atherectomy and drug-coated balloon angioplasty.  The patient does have residual below the knee disease for which I recommend aggressive medical therapy for now unless symptoms worsen.  I discussed with him the importance of a walking exercise program.  He reports left calf claudication but does not seem to be severe at the present time.  We will continue to observe for now.   ? ?2.  Moderate right carotid stenosis: The patient was found to have right carotid bruit and subsequent carotid Doppler showed 40 to 59% stenosis in the right carotid artery.  Repeat study in February 2024.  Continue aggressive treatment  of risk factors. ? ?3.  Hyperlipidemia: He is tolerating atorvastatin 20 mg daily which was started in February.  Recheck lipid and liver profile. ? ?4.  Refractory hypertension: This is a prolonged issue for him.  Recent angiography showed no significant renal artery stenosis.  He does have symptoms highly suggestive of sleep apnea and thus we will schedule a home sleep study. ?I elected to discontinue clonidine and add spironolactone 25 mg once daily.  Check basic metabolic profile in 1 week. ? ?5.  Cigar use: I discussed with him the importance of cessation. ? ?6.  Loud snoring: Stop bang score is 6.  I requested home sleep study. ? ? ?Disposition: Follow-up in 3 months. ? ?Signed, ? ?[name], MD  ?04/11/2022 8:14 AM    ?[place] ?

## 2022-04-14 ENCOUNTER — Telehealth: Payer: Self-pay | Admitting: *Deleted

## 2022-04-14 NOTE — Telephone Encounter (Signed)
Prior Authorization for HST sent to [facility] via web portal. No PA IS REQUIRED.  ?

## 2022-04-18 ENCOUNTER — Telehealth: Payer: Self-pay | Admitting: *Deleted

## 2022-04-18 ENCOUNTER — Ambulatory Visit: Payer: BC Managed Care – PPO | Admitting: General Practice

## 2022-04-18 LAB — BASIC METABOLIC PANEL
BUN/Creatinine Ratio: 25 — ABNORMAL HIGH (ref 10–24)
BUN: 29 mg/dL — ABNORMAL HIGH (ref 8–27)
CO2: 24 mmol/L (ref 20–29)
Calcium: 9.8 mg/dL (ref 8.6–10.2)
Chloride: 100 mmol/L (ref 96–106)
Creatinine, Ser: 1.17 mg/dL (ref 0.76–1.27)
Glucose: 223 mg/dL — ABNORMAL HIGH (ref 70–99)
Potassium: 4.3 mmol/L (ref 3.5–5.2)
Sodium: 140 mmol/L (ref 134–144)
eGFR: 70 mL/min/{1.73_m2} (ref >59)

## 2022-04-18 LAB — LIPID PANEL
Chol/HDL Ratio: 4.2 ratio (ref 0.0–5.0)
Cholesterol, Total: 118 mg/dL (ref 100–199)
HDL: 28 mg/dL — ABNORMAL LOW (ref >39)
LDL Chol Calc (NIH): 33 mg/dL (ref 0–99)
Triglycerides: 394 mg/dL — ABNORMAL HIGH (ref 0–149)
VLDL Cholesterol Cal: 57 mg/dL — ABNORMAL HIGH (ref 5–40)

## 2022-04-18 LAB — HEPATIC FUNCTION PANEL
ALT: 20 IU/L (ref 0–44)
AST: 25 IU/L (ref 0–40)
Albumin: 4.5 g/dL (ref 3.8–4.8)
Alkaline Phosphatase: 102 IU/L (ref 44–121)
Bilirubin Total: <0.2 mg/dL (ref 0.0–1.2)
Bilirubin, Direct: <0.1 mg/dL (ref 0.00–0.40)
Total Protein: 7.5 g/dL (ref 6.0–8.5)

## 2022-04-18 NOTE — Telephone Encounter (Signed)
FMLA papers faxed to [phone] and sent to be scanned ?

## 2022-04-24 ENCOUNTER — Encounter: Payer: Self-pay | Admitting: *Deleted

## 2022-05-02 ENCOUNTER — Telehealth: Payer: Self-pay | Admitting: Cardiovascular Disease

## 2022-05-02 NOTE — Telephone Encounter (Signed)
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 He has hx of  1.  Peripheral arterial disease: Status post right SFA directional atherectomy and drug-coated balloon angioplasty.  The patient does have residual below the knee disease for which I recommend aggressive medical therapy for now unless symptoms worsen.  I discussed with him the importance of a walking exercise program.  He reports left calf claudication but does not seem to be severe at the present time.  We will continue to observe for now.     2.  Moderate right carotid stenosis: The patient was found to have right carotid bruit and subsequent carotid Doppler showed 40 to 59% stenosis in the right carotid artery.  Repeat study in February 2024.  Continue aggressive treatment of risk factors.  Request to hold Plavix and aspirin for upcoming right shoulder scope/rotator cuff repair. There is no requested length of hold. Please send your response to p cv div preop.  Thank you, Emmaline Life, NP-C    05/02/2022, 2:24 PM [place] [address], [address] Office [phone] Fax [phone]

## 2022-05-02 NOTE — Telephone Encounter (Signed)
   Pre-operative Risk Assessment    Patient Name: Gabriel [name]  DOB: 1956/10/17 MRN: [id number] HEARTCARE STAFF-IMPORTANT INSTRUCTIONS 1 Red and Blue Text will auto delete once note is signed or closed. 2 Press F2 to navigate through template.   3 On drop down lists, L click to select >> R click to activate next field 4 Reason for Visit format is IMPORTANT!!  See Directions on No. 2 below. 5 Please review chart to determine if there is already a clearance note open for this procedure!!  DO NOT duplicate if a note already exists!!        Request for Surgical Clearance{ 1. What type of surgery is being performed? Enter name of procedure below and number of teeth if dental extraction.   Procedure:   RIGHT SHOULDER SCOPE ROTATOR CUFF REPAIR  2. When is this surgery scheduled? Press F2 to enter date below and place date in Reason for Visit (see directions below).  Date of Surgery:  Clearance TBD                              For convenience, highlight and copy (CTL+C) the Clearance MM/DD/YY phrase above. Click here to go to Reason for Visit.  Paste (CTL+V) the date.  Merchandiser, retail.  Then click button underneath called Add Clearance MM/DD/YY as free text.      3. What is the name of the Surgeon, the Surgeon's Group or Practice, phone and fax number?  Press F2 and list below  Surgeon:  DR Quillian Quince CAFFREY Surgeon's Group or Practice Name:  [facility] Phone number:  [phone] ([facility]) Fax number:  [phone]  6. What type of clearance is requested?  Medical or Cardiac Clearance only?  Pharmacy Clearance Only (Request is to hold medication only)?  Or Both?  Press F2 and select the clearance requested.  If both are needed, select both from the drop down list.     : Type of Clearance Requested:   - Pharmacy:  Hold Aspirin /PLAVIX  5. What type of anesthesia will be used?  Press F2 and select the anesthesia to be used for the procedure.  Type of Anesthesia:  General WITH INTERSCALENE  BLOCK 6. Are there any other requests or questions from the surgeon?    Additional requests/questionS  Signed, Eli Phillips   05/02/2022, 10:29 AM

## 2022-05-03 NOTE — Telephone Encounter (Signed)
   Primary Cardiologist: [name], MD  Chart reviewed as part of pre-operative protocol coverage. Given past medical history and time since last visit, based on ACC/AHA guidelines, CAM DAUPHIN would be at acceptable risk for the planned procedure without further cardiovascular testing.   His Plavix may be held for 5 days prior to his procedure.  Please resume as soon as hemostasis is achieved.  We would like his aspirin to be continued throughout the procedure if possible.  If bleeding risk felt to be too high it may be held for 5 days before the procedure.  Please resume aspirin once hemostasis is achieved.  I will route this recommendation to the requesting party via Epic fax function and remove from pre-op pool.  Please call with questions.  Jossie Ng. Jatoria Kneeland NP-C    05/03/2022, 4:41 PM [facility] [place] [address] Office [phone] Fax [phone]

## 2022-05-03 NOTE — Telephone Encounter (Signed)
Hold Plavix 5 days before and resume after.  Prefer to continue aspirin 81 mg once daily but if the bleeding risk is felt to be high, it can be held 5 days before.

## 2022-05-04 ENCOUNTER — Other Ambulatory Visit: Payer: Self-pay

## 2022-05-04 MED ORDER — FISH OIL 1000 MG PO CAPS
1000.0000 mg | ORAL_CAPSULE | Freq: Two times a day (BID) | ORAL | Status: DC
Start: 2022-05-04 — End: 2022-08-22

## 2022-05-16 ENCOUNTER — Ambulatory Visit (INDEPENDENT_AMBULATORY_CARE_PROVIDER_SITE_OTHER): Payer: BC Managed Care – PPO | Admitting: Cardiovascular Disease

## 2022-05-16 ENCOUNTER — Encounter: Payer: Self-pay | Admitting: Cardiovascular Disease

## 2022-05-16 ENCOUNTER — Ambulatory Visit: Payer: BC Managed Care – PPO | Admitting: Cardiovascular Disease

## 2022-05-16 VITALS — BP 148/79 | HR 88 | Ht 74.0 in | Wt 186.0 lb

## 2022-05-16 DIAGNOSIS — E785 Hyperlipidemia, unspecified: Secondary | ICD-10-CM

## 2022-05-16 DIAGNOSIS — I739 Peripheral vascular disease, unspecified: Secondary | ICD-10-CM

## 2022-05-16 DIAGNOSIS — I10 Essential (primary) hypertension: Secondary | ICD-10-CM | POA: Diagnosis not present

## 2022-05-16 DIAGNOSIS — I779 Disorder of arteries and arterioles, unspecified: Secondary | ICD-10-CM | POA: Diagnosis not present

## 2022-05-16 LAB — BASIC METABOLIC PANEL
BUN/Creatinine Ratio: 22 (ref 10–24)
BUN: 31 mg/dL — ABNORMAL HIGH (ref 8–27)
CO2: 24 mmol/L (ref 20–29)
Calcium: 9.5 mg/dL (ref 8.6–10.2)
Chloride: 100 mmol/L (ref 96–106)
Creatinine, Ser: 1.41 mg/dL — ABNORMAL HIGH (ref 0.76–1.27)
Glucose: 149 mg/dL — ABNORMAL HIGH (ref 70–99)
Potassium: 4.1 mmol/L (ref 3.5–5.2)
Sodium: 140 mmol/L (ref 134–144)
eGFR: 56 mL/min/{1.73_m2} — ABNORMAL LOW (ref >59)

## 2022-05-16 NOTE — Patient Instructions (Addendum)
Medication Instructions:  Your physician recommends that you continue on your current medications as directed. Please refer to the Current Medication list given to you today.  *If you need a refill on your cardiac medications before your next appointment, please call your pharmacy*   Lab Work: BMET today  If you have labs (blood work) drawn today and your tests are completely normal, you will receive your results only by: [place] (if you have MyChart) OR A paper copy in the mail If you have any lab test that is abnormal or we need to change your treatment, we will call you to review the results.   Testing/Procedures: Your physician has requested that you have a lower extremity arterial duplex in September. This test is an ultrasound of the arteries in the legs. It looks at arterial blood flow in the legs. Allow one hour for Lower Arterial scans. There are no restrictions or special instructions  Follow-Up: At [facility], you and your health needs are our priority.  As part of our continuing mission to provide you with exceptional heart care, we have created designated Provider Care Teams.  These Care Teams include your primary Cardiologist (physician) and Advanced Practice Providers (APPs -  Physician Assistants and Nurse Practitioners) who all work together to provide you with the care you need, when you need it.  We recommend signing up for the patient portal called "MyChart".  Sign up information is provided on this After Visit Summary.  MyChart is used to connect with patients for Virtual Visits (Telemedicine).  Patients are able to view lab/test results, encounter notes, upcoming appointments, etc.  Non-urgent messages can be sent to your provider as well.   To learn more about what you can do with MyChart, go to [email or web address].    Your next appointment:   3 month(s)  The format for your next appointment:   In Person  Provider:   [name], MD  {    Other Instructions EXERCISE PROGRAM FOR INDIVIDUALS WITH  PERIPHERAL ARTERIAL DISEASE (PAD)   General Information:   Research in vascular exercise has demonstrated remarkable improvement in symptoms of leg pain (claudication) without expensive or invasive interventions. Regular walking programs are extremely helpful for patients with PAD and intermittent claudication.  These steps are designed to help you get started with a safe and effective program to help you walk farther with less pain:   Walk at least three times a week (preferably every day).  Your goal is to build up to 30-45 minutes of total walking time (not counting rest breaks). It may take you several weeks to build up your exercise time starting at 5-10 minutes or whatever you can tolerate.  Walk as far as possible using moderate to maximal pain (7-8 on the scale below) as a signal to stop, and resume walking when the pain goes away.  On a treadmill, set the speed and grade at a level that brings on the claudication pain within 3 to 5 minutes. Walk at this rate until you experience claudication of moderate severity, rest until the pain improves, and then resume walking.  Over time, you will be able to walk longer at the designated speed and grade; workload should then be increased until you develop the pain within 3 to 5 minutes once again.  This regimen will induce a significant benefit. Studies have demonstrated that participants may be able to walk up to three or four times farther and have less leg pain, within twelve weeks,  by following this protocol.  Pain Scale    0_____1_____2_____3_____4_____5_____6_____7_____8_____9_____10   No Pain                                   Moderate Pain                               Maximal Pain

## 2022-05-16 NOTE — Progress Notes (Signed)
Cardiology Office Note   Date:  05/16/2022   ID:  Gabriel [name], DOB Sep 26, 1956, MRN [id number]  PCP:  Bernerd Limbo, MD  Cardiologist:   [name], MD   No chief complaint on file.     History of Present Illness: Gabriel [name] is a 66 y.o. male who is here today for follow-up visit regarding peripheral arterial disease and uncontrolled hypertension.   He has no previous cardiac history.  He has known history of essential hypertension, borderline diabetes and previous bladder cancer.  He smokes cigars daily.  He reports family history of coronary artery disease. He was seen by [facility] cardiology last year for atypical chest pain.  He underwent a treadmill nuclear stress test which showed no evidence of ischemia. He was seen in February of this year for severe right calf claudication and numbness in both feet.  He underwent noninvasive vascular studies which showed an ABI of 0.63 on the right and 0.88 on the left.  Duplex showed severe stenosis in the right distal SFA and moderate left SFA disease.    I proceeded with angiography in February which showed no significant renal artery stenosis, no significant aortoiliac disease and severe subtotal occlusion of the right SFA with significant below the knee disease.  I performed directional arthrectomy and drug-coated balloon angioplasty to the right SFA.   He is known to have refractory hypertension with no evidence of renal artery stenosis.  During last visit, I switched him from clonidine to spironolactone.  In addition, a home sleep study was requested. He reports that his blood pressure was elevated because he was out of olmesartan-amlodipine-hydrochlorothiazide.  He resumed the medications along with spironolactone and his blood pressure improved.  He is now reporting worsening left calf claudication.  He has some numbness in the right foot but he no longer has right calf claudication.   Past Medical History:  Diagnosis Date    Bladder cancer ([place])    Diabetes mellitus without complication ([place]) 4098   borderline- not taking prescribed med   Headache(784.0) 1990's   not current problem   Hypertension     Past Surgical History:  Procedure Laterality Date   ABDOMINAL AORTOGRAM W/LOWER EXTREMITY N/A 01/25/2022   Procedure: ABDOMINAL AORTOGRAM W/LOWER EXTREMITY;  Surgeon: Wellington Hampshire, MD;  Location: [place] [facility];  Service: Cardiovascular;  Laterality: N/A;   CYSTOSCOPY W/ URETERAL STENT PLACEMENT Bilateral 09/12/2013   Procedure: CYSTOSCOPY WITH BILATERAL  RETROGRADE PYELOGRAM  ;  Surgeon: Alexis Frock, MD;  Location: [facility];  Service: Urology;  Laterality: Bilateral;   KNEE ARTHROSCOPY Right many years ago-   doesn't remember date   NASAL POLYP SURGERY  date unknown   PERIPHERAL VASCULAR ATHERECTOMY Right 01/25/2022   Procedure: PERIPHERAL VASCULAR ATHERECTOMY;  Surgeon: Wellington Hampshire, MD;  Location: [place] [facility];  Service: Cardiovascular;  Laterality: Right;   RECTAL SURGERY  date unknown   TRANSURETHRAL RESECTION OF BLADDER TUMOR WITH GYRUS (TURBT-GYRUS) N/A 09/12/2013   Procedure: TRANSURETHRAL RESECTION OF BLADDER TUMOR WITH GYRUS (TURBT-GYRUS);  Surgeon: Alexis Frock, MD;  [facility];  Service: Urology;  Laterality: N/A;  1 HR      Current Outpatient Medications  Medication Sig Dispense Refill   aspirin 81 MG EC tablet Take 81 mg by mouth daily.     atorvastatin (LIPITOR) 20 MG tablet Take 1 tablet (20 mg total) by mouth daily. 90 tablet 3   clopidogrel (PLAVIX) 75 MG  tablet Take 1 tablet (75 mg total) by mouth daily. 30 tablet 6   glyBURIDE-metformin (GLUCOVANCE) 2.5-500 MG tablet Take 1 tablet by mouth 2 (two) times daily.     Olmesartan-amLODIPine-HCTZ 40-10-25 MG TABS Take 1 tablet by mouth daily. 90 tablet 2   Omega-3 Fatty Acids (FISH OIL) 1000 MG CAPS Take 1 capsule (1,000 mg total) by mouth 2 (two) times daily.     spironolactone  (ALDACTONE) 25 MG tablet Take 1 tablet (25 mg total) by mouth daily. 90 tablet 1   traMADol (ULTRAM) 50 MG tablet Take 50 mg by mouth every 6 (six) hours as needed.     No current facility-administered medications for this visit.    Allergies:   Patient has no known allergies.    Social History:  The patient  reports that he quit smoking about 31 years ago. His smoking use included cigarettes. He does not have any smokeless tobacco history on file. He reports that he does not drink alcohol and does not use drugs.   Family History:  [name] family history is remarkable for coronary artery disease and hypertension.   ROS:  Please see the history of present illness.   Otherwise, review of systems are positive for none.   All other systems are reviewed and negative.    PHYSICAL EXAM: VS:  BP (!) 148/79   Pulse 88   Ht '6\' 2"'$  (1.88 m)   Wt 186 lb (84.4 kg)   SpO2 97%   BMI 23.88 kg/m  , BMI Body mass index is 23.88 kg/m. GEN: Well nourished, well developed, in no acute distress  HEENT: normal  Neck: no JVD,  or masses.  Right carotid bruit. Cardiac: RRR; no murmurs, rubs, or gallops,no edema  Respiratory:  clear to auscultation bilaterally, normal work of breathing GI: soft, nontender, nondistended, + BS MS: no deformity or atrophy  Skin: warm and dry, no rash Neuro:  Strength and sensation are intact Psych: euthymic mood, full affect Vascular: Femoral pulses are normal bilaterally.    EKG:  EKG is not ordered today.    Recent Labs: 04/04/2022: Hemoglobin 13.1; Platelets 257 04/18/2022: ALT 20; BUN 29; Creatinine, Ser 1.17; Potassium 4.3; Sodium 140    Lipid Panel    Component Value Date/Time   CHOL 118 04/18/2022 0840   TRIG 394 (H) 04/18/2022 0840   HDL 28 (L) 04/18/2022 0840   CHOLHDL 4.2 04/18/2022 0840   LDLCALC 33 04/18/2022 0840      Wt Readings from Last 3 Encounters:  05/16/22 186 lb (84.4 kg)  04/11/22 191 lb 12.8 oz (87 kg)  04/04/22 191 lb 6.4 oz  (86.8 kg)           View : No data to display.            ASSESSMENT AND PLAN:  1.  Peripheral arterial disease: Status post right SFA directional atherectomy and drug-coated balloon angioplasty.  The patient does have residual below the knee disease for which I recommend aggressive medical therapy for now unless symptoms worsen.  He reports worsening left calf claudication but his most recent ABI was 0.85 and his symptoms are still not lifestyle limiting.  I advised him to continue with a walking exercise program and will reevaluate in 3 months.  If no improvement, will proceed with angiography and endovascular intervention.  2.  Moderate right carotid stenosis: The patient was found to have right carotid bruit and subsequent carotid Doppler showed 40 to 59% stenosis in  the right carotid artery.  Repeat study in February 2024.  Continue aggressive treatment of risk factors.  3.  Hyperlipidemia: Continue treatment with atorvastatin.  Repeat lipid profile showed improvement in LDL which was down to 33.  4.  Refractory hypertension: His blood pressure is now reasonably controlled on current medications.  I requested basic metabolic profile given initiation of spironolactone last month.  5.  Cigar use: I discussed with him the importance of cessation.  6.  Loud snoring: Home sleep study was requested.  7.  Preoperative risk evaluation for shoulder surgery: Low risk from a cardiac standpoint.  Hold Plavix 5 days before.   Disposition: Follow-up in 3 months.  Signed,  [name], MD  05/16/2022 4:58 PM    [place]

## 2022-05-18 ENCOUNTER — Other Ambulatory Visit: Payer: Self-pay | Admitting: *Deleted

## 2022-05-18 DIAGNOSIS — I10 Essential (primary) hypertension: Secondary | ICD-10-CM

## 2022-06-02 ENCOUNTER — Telehealth: Payer: Self-pay | Admitting: Cardiovascular Disease

## 2022-06-02 NOTE — Telephone Encounter (Signed)
Patient called in to say that he needs to have his flma paperwork redone from 4/24-4/26. Please advise

## 2022-06-07 NOTE — Telephone Encounter (Signed)
Left a message for the patient to call back.  

## 2022-06-14 NOTE — Telephone Encounter (Signed)
Left a message for the patient to call back if anything was needed.

## 2022-06-20 ENCOUNTER — Ambulatory Visit (HOSPITAL_BASED_OUTPATIENT_CLINIC_OR_DEPARTMENT_OTHER): Payer: BC Managed Care – PPO | Attending: Cardiovascular Disease | Admitting: Cardiology

## 2022-07-09 ENCOUNTER — Ambulatory Visit: Payer: Self-pay | Admitting: Physician Assistant

## 2022-07-09 NOTE — H&P (View-Only) (Signed)
Gabriel [name] is an 66 y.o. male.   Chief Complaint: right shoulder pain HPI: Patient is a 65 year-old male who presents with about 10 months now of right shoulder pain.  Denies any injury or trauma at the onset.  It is painful to raise the arm over his head or reach around his back. He is right hand dominant. He returns with a focal high grade greater than 50% tear of infraspinatus and supraspinatus.  Failed injection.  Daily pain.  He works two jobs.  He has pain at night and cannot sleep.  We did the injection in December 2022.  He has painful arc of motion, moderate weakness, cannot sleep.  He is in need of surgery  Past Medical History:  Diagnosis Date   Bladder cancer ([place])    Diabetes mellitus without complication ([place]) 3557   borderline- not taking prescribed med   Headache(784.0) 1990's   not current problem   Hypertension     Past Surgical History:  Procedure Laterality Date   ABDOMINAL AORTOGRAM W/LOWER EXTREMITY N/A 01/25/2022   Procedure: ABDOMINAL AORTOGRAM W/LOWER EXTREMITY;  Surgeon: Wellington Hampshire, MD;  Location: [facility];  Service: Cardiovascular;  Laterality: N/A;   CYSTOSCOPY W/ URETERAL STENT PLACEMENT Bilateral 09/12/2013   Procedure: CYSTOSCOPY WITH BILATERAL  RETROGRADE PYELOGRAM  ;  Surgeon: Alexis Frock, MD;  Location: [facility];  Service: Urology;  Laterality: Bilateral;   KNEE ARTHROSCOPY Right many years ago-   doesn't remember date   NASAL POLYP SURGERY  date unknown   PERIPHERAL VASCULAR ATHERECTOMY Right 01/25/2022   Procedure: PERIPHERAL VASCULAR ATHERECTOMY;  Surgeon: Wellington Hampshire, MD;  Location: [facility];  Service: Cardiovascular;  Laterality: Right;   RECTAL SURGERY  date unknown   TRANSURETHRAL RESECTION OF BLADDER TUMOR WITH GYRUS (TURBT-GYRUS) N/A 09/12/2013   Procedure: TRANSURETHRAL RESECTION OF BLADDER TUMOR WITH GYRUS (TURBT-GYRUS);  Surgeon: Alexis Frock, MD;  Location: [facility];   Service: Urology;  Laterality: N/A;  1 HR     No family history on file. Social History:  reports that he quit smoking about 31 years ago. His smoking use included cigarettes. He does not have any smokeless tobacco history on file. He reports that he does not drink alcohol and does not use drugs.  Allergies: No Known Allergies  (Not in a hospital admission)   No results found for this or any previous visit (from the past 48 hour(s)). No results found.  Review of Systems  Musculoskeletal:  Positive for arthralgias.  All other systems reviewed and are negative.   There were no vitals taken for this visit. Physical Exam Constitutional:      General: He is not in acute distress.    Appearance: Normal appearance.  HENT:     Head: Normocephalic and atraumatic.  Eyes:     Extraocular Movements: Extraocular movements intact.     Pupils: Pupils are equal, round, and reactive to light.  Cardiovascular:     Rate and Rhythm: Normal rate.     Pulses: Normal pulses.  Pulmonary:     Effort: Pulmonary effort is normal. No respiratory distress.  Abdominal:     General: Abdomen is flat. There is no distension.  Musculoskeletal:     Cervical back: Normal range of motion.     Comments: Examination of the right upper extremity shows he is neurovascularly intact.  Painful arc of motion and limited range of motion with the right shoulder, particularly with  internal rotation and reaching behind the back.  He has positive impingement with Hawkins testing and hyperadduction of the arm across the chest.  Skin:    General: Skin is warm and dry.     Findings: No erythema or rash.  Neurological:     General: No focal deficit present.     Mental Status: He is alert and oriented to person, place, and time.  Psychiatric:        Mood and Affect: Mood normal.        Behavior: Behavior normal.      Assessment/Plan Right shoulder rotator cuff tear   We went through risks and benefits of surgery,  right shoulder arthroscopy, acromioplasty, distal clavicle excision, probable rotator cuff repair.  It is conceivable he may get by with a debridement, but I would say it is probable that he needs it repaired.  Immobilization for 4-6 weeks.  We would need clearances.  He has some PAD and may need to get something done with one of his legs regarding that, which may take precedence over his shoulder surgery.  He is continuing to work at this point.  He is requesting something for pain.  He is given Tramadol for breakthrough pain at night.   Chriss Czar, PA-C 07/09/2022, 3:26 PM

## 2022-07-09 NOTE — H&P (Signed)
Gabriel [name] is an 66 y.o. male.   Chief Complaint: right shoulder pain HPI: Patient is a 66 year-old male who presents with about 10 months now of right shoulder pain.  Denies any injury or trauma at the onset.  It is painful to raise the arm over his head or reach around his back. He is right hand dominant. He returns with a focal high grade greater than 50% tear of infraspinatus and supraspinatus.  Failed injection.  Daily pain.  He works two jobs.  He has pain at night and cannot sleep.  We did the injection in December 2022.  He has painful arc of motion, moderate weakness, cannot sleep.  He is in need of surgery  Past Medical History:  Diagnosis Date   Bladder cancer ([place])    Diabetes mellitus without complication ([place]) 6578   borderline- not taking prescribed med   Headache(784.0) 1990's   not current problem   Hypertension     Past Surgical History:  Procedure Laterality Date   ABDOMINAL AORTOGRAM W/LOWER EXTREMITY N/A 01/25/2022   Procedure: ABDOMINAL AORTOGRAM W/LOWER EXTREMITY;  Surgeon: Wellington Hampshire, MD;  Location: [facility];  Service: Cardiovascular;  Laterality: N/A;   CYSTOSCOPY W/ URETERAL STENT PLACEMENT Bilateral 09/12/2013   Procedure: CYSTOSCOPY WITH BILATERAL  RETROGRADE PYELOGRAM  ;  Surgeon: Alexis Frock, MD;  Location: [facility];  Service: Urology;  Laterality: Bilateral;   KNEE ARTHROSCOPY Right many years ago-   doesn't remember date   NASAL POLYP SURGERY  date unknown   PERIPHERAL VASCULAR ATHERECTOMY Right 01/25/2022   Procedure: PERIPHERAL VASCULAR ATHERECTOMY;  Surgeon: Wellington Hampshire, MD;  Location: [facility];  Service: Cardiovascular;  Laterality: Right;   RECTAL SURGERY  date unknown   TRANSURETHRAL RESECTION OF BLADDER TUMOR WITH GYRUS (TURBT-GYRUS) N/A 09/12/2013   Procedure: TRANSURETHRAL RESECTION OF BLADDER TUMOR WITH GYRUS (TURBT-GYRUS);  Surgeon: Alexis Frock, MD;  Location: [facility];   Service: Urology;  Laterality: N/A;  1 HR     No family history on file. Social History:  reports that he quit smoking about 31 years ago. His smoking use included cigarettes. He does not have any smokeless tobacco history on file. He reports that he does not drink alcohol and does not use drugs.  Allergies: No Known Allergies  (Not in a hospital admission)   No results found for this or any previous visit (from the past 48 hour(s)). No results found.  Review of Systems  Musculoskeletal:  Positive for arthralgias.  All other systems reviewed and are negative.   There were no vitals taken for this visit. Physical Exam Constitutional:      General: He is not in acute distress.    Appearance: Normal appearance.  HENT:     Head: Normocephalic and atraumatic.  Eyes:     Extraocular Movements: Extraocular movements intact.     Pupils: Pupils are equal, round, and reactive to light.  Cardiovascular:     Rate and Rhythm: Normal rate.     Pulses: Normal pulses.  Pulmonary:     Effort: Pulmonary effort is normal. No respiratory distress.  Abdominal:     General: Abdomen is flat. There is no distension.  Musculoskeletal:     Cervical back: Normal range of motion.     Comments: Examination of the right upper extremity shows he is neurovascularly intact.  Painful arc of motion and limited range of motion with the right shoulder, particularly with  internal rotation and reaching behind the back.  He has positive impingement with Hawkins testing and hyperadduction of the arm across the chest.  Skin:    General: Skin is warm and dry.     Findings: No erythema or rash.  Neurological:     General: No focal deficit present.     Mental Status: He is alert and oriented to person, place, and time.  Psychiatric:        Mood and Affect: Mood normal.        Behavior: Behavior normal.      Assessment/Plan Right shoulder rotator cuff tear   We went through risks and benefits of surgery,  right shoulder arthroscopy, acromioplasty, distal clavicle excision, probable rotator cuff repair.  It is conceivable he may get by with a debridement, but I would say it is probable that he needs it repaired.  Immobilization for 4-6 weeks.  We would need clearances.  He has some PAD and may need to get something done with one of his legs regarding that, which may take precedence over his shoulder surgery.  He is continuing to work at this point.  He is requesting something for pain.  He is given Tramadol for breakthrough pain at night.   Chriss Czar, PA-C 07/09/2022, 3:26 PM

## 2022-07-21 ENCOUNTER — Encounter (HOSPITAL_BASED_OUTPATIENT_CLINIC_OR_DEPARTMENT_OTHER): Payer: Self-pay | Admitting: Orthopedic Surgery

## 2022-07-21 ENCOUNTER — Other Ambulatory Visit: Payer: Self-pay

## 2022-07-27 ENCOUNTER — Encounter (HOSPITAL_BASED_OUTPATIENT_CLINIC_OR_DEPARTMENT_OTHER)
Admission: RE | Admit: 2022-07-27 | Discharge: 2022-07-27 | Disposition: A | Discharge status: Home / Self Care | Payer: BC Managed Care – PPO | Source: Ambulatory Visit | Attending: Orthopedic Surgery | Admitting: Orthopedic Surgery

## 2022-07-27 DIAGNOSIS — E119 Type 2 diabetes mellitus without complications: Secondary | ICD-10-CM | POA: Insufficient documentation

## 2022-07-27 DIAGNOSIS — S43401A Unspecified sprain of right shoulder joint, initial encounter: Secondary | ICD-10-CM | POA: Diagnosis not present

## 2022-07-27 DIAGNOSIS — M19011 Primary osteoarthritis, right shoulder: Secondary | ICD-10-CM | POA: Diagnosis not present

## 2022-07-27 DIAGNOSIS — I10 Essential (primary) hypertension: Secondary | ICD-10-CM | POA: Diagnosis not present

## 2022-07-27 DIAGNOSIS — E1151 Type 2 diabetes mellitus with diabetic peripheral angiopathy without gangrene: Secondary | ICD-10-CM | POA: Diagnosis not present

## 2022-07-27 DIAGNOSIS — Z01812 Encounter for preprocedural laboratory examination: Secondary | ICD-10-CM | POA: Insufficient documentation

## 2022-07-27 DIAGNOSIS — X58XXXA Exposure to other specified factors, initial encounter: Secondary | ICD-10-CM | POA: Diagnosis not present

## 2022-07-27 DIAGNOSIS — M75111 Incomplete rotator cuff tear or rupture of right shoulder, not specified as traumatic: Secondary | ICD-10-CM | POA: Diagnosis present

## 2022-07-27 DIAGNOSIS — M25811 Other specified joint disorders, right shoulder: Secondary | ICD-10-CM | POA: Diagnosis not present

## 2022-07-27 LAB — BASIC METABOLIC PANEL
Anion gap: 8 (ref 5–15)
BUN: 28 mg/dL — ABNORMAL HIGH (ref 8–23)
CO2: 26 mmol/L (ref 22–32)
Calcium: 9.2 mg/dL (ref 8.9–10.3)
Chloride: 105 mmol/L (ref 98–111)
Creatinine, Ser: 1.36 mg/dL — ABNORMAL HIGH (ref 0.61–1.24)
GFR, Estimated: 58 mL/min — ABNORMAL LOW (ref >60)
Glucose, Bld: 124 mg/dL — ABNORMAL HIGH (ref 70–99)
Potassium: 4.1 mmol/L (ref 3.5–5.1)
Sodium: 139 mmol/L (ref 135–145)

## 2022-07-27 NOTE — Progress Notes (Signed)

## 2022-07-28 ENCOUNTER — Encounter (HOSPITAL_BASED_OUTPATIENT_CLINIC_OR_DEPARTMENT_OTHER): Payer: Self-pay | Admitting: Orthopedic Surgery

## 2022-07-28 ENCOUNTER — Ambulatory Visit (HOSPITAL_BASED_OUTPATIENT_CLINIC_OR_DEPARTMENT_OTHER): Payer: BC Managed Care – PPO | Admitting: Anesthesiology

## 2022-07-28 ENCOUNTER — Ambulatory Visit (HOSPITAL_BASED_OUTPATIENT_CLINIC_OR_DEPARTMENT_OTHER)
Admission: RE | Admit: 2022-07-28 | Discharge: 2022-07-28 | Disposition: A | Discharge status: Home / Self Care | Payer: BC Managed Care – PPO | Attending: Orthopedic Surgery | Admitting: Orthopedic Surgery

## 2022-07-28 ENCOUNTER — Other Ambulatory Visit: Payer: Self-pay

## 2022-07-28 ENCOUNTER — Encounter (HOSPITAL_BASED_OUTPATIENT_CLINIC_OR_DEPARTMENT_OTHER)
Admission: RE | Disposition: A | Discharge status: Home / Self Care | Payer: Self-pay | Source: Home / Self Care | Attending: Orthopedic Surgery

## 2022-07-28 DIAGNOSIS — M75111 Incomplete rotator cuff tear or rupture of right shoulder, not specified as traumatic: Secondary | ICD-10-CM | POA: Diagnosis not present

## 2022-07-28 DIAGNOSIS — M25811 Other specified joint disorders, right shoulder: Secondary | ICD-10-CM | POA: Insufficient documentation

## 2022-07-28 DIAGNOSIS — E119 Type 2 diabetes mellitus without complications: Secondary | ICD-10-CM

## 2022-07-28 DIAGNOSIS — M19011 Primary osteoarthritis, right shoulder: Secondary | ICD-10-CM | POA: Insufficient documentation

## 2022-07-28 DIAGNOSIS — I10 Essential (primary) hypertension: Secondary | ICD-10-CM | POA: Insufficient documentation

## 2022-07-28 DIAGNOSIS — S43401A Unspecified sprain of right shoulder joint, initial encounter: Secondary | ICD-10-CM | POA: Insufficient documentation

## 2022-07-28 DIAGNOSIS — X58XXXA Exposure to other specified factors, initial encounter: Secondary | ICD-10-CM | POA: Insufficient documentation

## 2022-07-28 DIAGNOSIS — E1151 Type 2 diabetes mellitus with diabetic peripheral angiopathy without gangrene: Secondary | ICD-10-CM | POA: Insufficient documentation

## 2022-07-28 HISTORY — PX: SHOULDER ARTHROSCOPY WITH SUBACROMIAL DECOMPRESSION: SHX5684

## 2022-07-28 HISTORY — DX: Hyperlipidemia, unspecified: E78.5

## 2022-07-28 LAB — GLUCOSE, CAPILLARY
Glucose-Capillary: 131 mg/dL — ABNORMAL HIGH (ref 70–99)
Glucose-Capillary: 161 mg/dL — ABNORMAL HIGH (ref 70–99)

## 2022-07-28 SURGERY — SHOULDER ARTHROSCOPY WITH SUBACROMIAL DECOMPRESSION
Anesthesia: Regional | Site: Shoulder | Laterality: Right

## 2022-07-28 MED ORDER — OXYCODONE HCL 5 MG PO TABS: ORAL_TABLET | ORAL | 0 refills | Status: DC

## 2022-07-28 MED ORDER — DEXAMETHASONE SODIUM PHOSPHATE 10 MG/ML IJ SOLN
INTRAMUSCULAR | Status: AC
  Filled 2022-07-28: qty 1

## 2022-07-28 MED ORDER — DEXAMETHASONE SODIUM PHOSPHATE 10 MG/ML IJ SOLN
INTRAMUSCULAR | Status: DC | PRN
  Administered 2022-07-28: 4 mg via INTRAVENOUS

## 2022-07-28 MED ORDER — METOPROLOL TARTRATE 5 MG/5ML IV SOLN
INTRAVENOUS | Status: AC
  Filled 2022-07-28: qty 5

## 2022-07-28 MED ORDER — MIDAZOLAM HCL 2 MG/2ML IJ SOLN
2.0000 mg | Freq: Once | INTRAMUSCULAR | Status: AC
  Administered 2022-07-28: 2 mg via INTRAVENOUS

## 2022-07-28 MED ORDER — ACETAMINOPHEN 500 MG PO TABS
1000.0000 mg | ORAL_TABLET | Freq: Once | ORAL | Status: AC
  Administered 2022-07-28: 1000 mg via ORAL

## 2022-07-28 MED ORDER — ACETAMINOPHEN 500 MG PO TABS: 1000.0000 mg | ORAL_TABLET | Freq: Four times a day (QID) | ORAL | 2 refills | Status: DC | PRN

## 2022-07-28 MED ORDER — FENTANYL CITRATE (PF) 100 MCG/2ML IJ SOLN
INTRAMUSCULAR | Status: AC
  Filled 2022-07-28: qty 2

## 2022-07-28 MED ORDER — ROCURONIUM BROMIDE 10 MG/ML (PF) SYRINGE
PREFILLED_SYRINGE | INTRAVENOUS | Status: AC
  Filled 2022-07-28: qty 10

## 2022-07-28 MED ORDER — FENTANYL CITRATE (PF) 100 MCG/2ML IJ SOLN
50.0000 ug | Freq: Once | INTRAMUSCULAR | Status: AC
  Administered 2022-07-28: 50 ug via INTRAVENOUS

## 2022-07-28 MED ORDER — ONDANSETRON HCL 4 MG/2ML IJ SOLN
INTRAMUSCULAR | Status: AC
  Filled 2022-07-28: qty 2

## 2022-07-28 MED ORDER — PHENYLEPHRINE HCL-NACL 20-0.9 MG/250ML-% IV SOLN
INTRAVENOUS | Status: DC | PRN
  Administered 2022-07-28: 60 ug/min via INTRAVENOUS

## 2022-07-28 MED ORDER — METHOCARBAMOL 500 MG PO TABS: 500.0000 mg | ORAL_TABLET | Freq: Four times a day (QID) | ORAL | 0 refills | Status: DC | PRN

## 2022-07-28 MED ORDER — ACETAMINOPHEN 500 MG PO TABS
ORAL_TABLET | ORAL | Status: AC
  Filled 2022-07-28: qty 2

## 2022-07-28 MED ORDER — CEFAZOLIN SODIUM-DEXTROSE 2-4 GM/100ML-% IV SOLN
INTRAVENOUS | Status: AC
  Filled 2022-07-28: qty 100

## 2022-07-28 MED ORDER — EPHEDRINE SULFATE (PRESSORS) 50 MG/ML IJ SOLN
INTRAMUSCULAR | Status: DC | PRN
  Administered 2022-07-28 (×2): 5 mg via INTRAVENOUS

## 2022-07-28 MED ORDER — MIDAZOLAM HCL 2 MG/2ML IJ SOLN
INTRAMUSCULAR | Status: AC
  Filled 2022-07-28: qty 2

## 2022-07-28 MED ORDER — EPHEDRINE 5 MG/ML INJ
INTRAVENOUS | Status: AC
  Filled 2022-07-28: qty 5

## 2022-07-28 MED ORDER — CEFAZOLIN SODIUM-DEXTROSE 2-4 GM/100ML-% IV SOLN
2.0000 g | INTRAVENOUS | Status: AC
  Administered 2022-07-28: 2 g via INTRAVENOUS

## 2022-07-28 MED ORDER — ONDANSETRON HCL 4 MG/2ML IJ SOLN
INTRAMUSCULAR | Status: DC | PRN
  Administered 2022-07-28: 4 mg via INTRAVENOUS

## 2022-07-28 MED ORDER — PHENYLEPHRINE HCL (PRESSORS) 10 MG/ML IV SOLN
INTRAVENOUS | Status: AC
Start: 2022-07-28 — End: ?
  Filled 2022-07-28: qty 1

## 2022-07-28 MED ORDER — PROPOFOL 10 MG/ML IV BOLUS
INTRAVENOUS | Status: DC | PRN
  Administered 2022-07-28: 140 mg via INTRAVENOUS

## 2022-07-28 MED ORDER — METOPROLOL TARTRATE 5 MG/5ML IV SOLN
INTRAVENOUS | Status: DC | PRN
  Administered 2022-07-28 (×3): 1 mg via INTRAVENOUS

## 2022-07-28 MED ORDER — BUPIVACAINE-EPINEPHRINE (PF) 0.5% -1:200000 IJ SOLN
INTRAMUSCULAR | Status: AC
Start: 2022-07-28 — End: ?
  Filled 2022-07-28: qty 30

## 2022-07-28 MED ORDER — BUPIVACAINE LIPOSOME 1.3 % IJ SUSP
INTRAMUSCULAR | Status: DC | PRN
  Administered 2022-07-28: 10 mL via PERINEURAL

## 2022-07-28 MED ORDER — SODIUM CHLORIDE 0.9 % IR SOLN
Status: DC | PRN
  Administered 2022-07-28: 6000 mL

## 2022-07-28 MED ORDER — FENTANYL CITRATE (PF) 100 MCG/2ML IJ SOLN
INTRAMUSCULAR | Status: DC | PRN
  Administered 2022-07-28: 50 ug via INTRAVENOUS

## 2022-07-28 MED ORDER — OXYCODONE HCL 5 MG PO TABS: 5.0000 mg | ORAL_TABLET | Freq: Once | ORAL | Status: DC | PRN

## 2022-07-28 MED ORDER — LACTATED RINGERS IV SOLN: INTRAVENOUS | Status: DC

## 2022-07-28 MED ORDER — ESMOLOL HCL 100 MG/10ML IV SOLN
INTRAVENOUS | Status: AC
  Filled 2022-07-28: qty 10

## 2022-07-28 MED ORDER — FENTANYL CITRATE (PF) 100 MCG/2ML IJ SOLN: 25.0000 ug | INTRAMUSCULAR | Status: DC | PRN

## 2022-07-28 MED ORDER — SUGAMMADEX SODIUM 200 MG/2ML IV SOLN
INTRAVENOUS | Status: DC | PRN
  Administered 2022-07-28: 100 mg via INTRAVENOUS
  Administered 2022-07-28: 200 mg via INTRAVENOUS

## 2022-07-28 MED ORDER — LIDOCAINE 2% (20 MG/ML) 5 ML SYRINGE
INTRAMUSCULAR | Status: AC
  Filled 2022-07-28: qty 5

## 2022-07-28 MED ORDER — ESMOLOL HCL 100 MG/10ML IV SOLN
INTRAVENOUS | Status: DC | PRN
  Administered 2022-07-28: 50 mg via INTRAVENOUS

## 2022-07-28 MED ORDER — PROPOFOL 10 MG/ML IV BOLUS
INTRAVENOUS | Status: AC
  Filled 2022-07-28: qty 20

## 2022-07-28 MED ORDER — OXYCODONE HCL 5 MG/5ML PO SOLN: 5.0000 mg | Freq: Once | ORAL | Status: DC | PRN

## 2022-07-28 MED ORDER — BUPIVACAINE-EPINEPHRINE (PF) 0.5% -1:200000 IJ SOLN
INTRAMUSCULAR | Status: DC | PRN
  Administered 2022-07-28: 15 mL via PERINEURAL

## 2022-07-28 MED ORDER — ROCURONIUM BROMIDE 100 MG/10ML IV SOLN
INTRAVENOUS | Status: DC | PRN
  Administered 2022-07-28: 60 mg via INTRAVENOUS
  Administered 2022-07-28: 10 mg via INTRAVENOUS

## 2022-07-28 MED ORDER — LIDOCAINE HCL (CARDIAC) PF 100 MG/5ML IV SOSY
PREFILLED_SYRINGE | INTRAVENOUS | Status: DC | PRN
  Administered 2022-07-28: 50 mg via INTRAVENOUS

## 2022-07-28 SURGICAL SUPPLY — 75 items
AID PSTN UNV HD RSTRNT DISP (MISCELLANEOUS) ×2
APL SKNCLS STERI-STRIP NONHPOA (GAUZE/BANDAGES/DRESSINGS)
BENZOIN TINCTURE PRP APPL 2/3 (GAUZE/BANDAGES/DRESSINGS) IMPLANT
BLADE AVERAGE 25X9 (BLADE) IMPLANT
BLADE SURG 15 STRL LF DISP TIS (BLADE) IMPLANT
BLADE SURG 15 STRL SS (BLADE)
BUR EGG 3PK/BX (BURR) IMPLANT
BURR OVAL 8 FLU 4.0X13 (MISCELLANEOUS) IMPLANT
CANNULA 5.75X71 LONG (CANNULA) IMPLANT
CANNULA SHOULDER 7CM (CANNULA) ×3 IMPLANT
CANNULA TWIST IN 8.25X7CM (CANNULA) IMPLANT
CLEANER CAUTERY TIP 5X5 PAD (MISCELLANEOUS) IMPLANT
DISSECTOR  3.8MM X 13CM (MISCELLANEOUS)
DISSECTOR 3.8MM X 13CM (MISCELLANEOUS) IMPLANT
DISSECTOR 4.0MM X 13CM (MISCELLANEOUS) IMPLANT
DRAPE STERI 35X30 U-POUCH (DRAPES) ×3 IMPLANT
DRAPE SURG 17X23 STRL (DRAPES) ×3 IMPLANT
DRAPE U-SHAPE 76X120 STRL (DRAPES) ×6 IMPLANT
DRESSING MEPILEX FLEX 4X4 (GAUZE/BANDAGES/DRESSINGS) IMPLANT
DRSG EMULSION OIL 3X3 NADH (GAUZE/BANDAGES/DRESSINGS) ×3 IMPLANT
DRSG MEPILEX FLEX 4X4 (GAUZE/BANDAGES/DRESSINGS)
DRSG MEPILEX POST OP 4X8 (GAUZE/BANDAGES/DRESSINGS) IMPLANT
DRSG PAD ABDOMINAL 8X10 ST (GAUZE/BANDAGES/DRESSINGS) ×3 IMPLANT
DURAPREP 26ML APPLICATOR (WOUND CARE) ×3 IMPLANT
ELECT REM PT RETURN 9FT ADLT (ELECTROSURGICAL) ×2
ELECTRODE REM PT RTRN 9FT ADLT (ELECTROSURGICAL) ×3 IMPLANT
GAUZE SPONGE 4X4 12PLY STRL (GAUZE/BANDAGES/DRESSINGS) ×3 IMPLANT
GLOVE BIO SURGEON STRL SZ7.5 (GLOVE) ×3 IMPLANT
GLOVE BIOGEL PI IND STRL 8 (GLOVE) ×6 IMPLANT
GLOVE BIOGEL PI INDICATOR 8 (GLOVE) ×4
GLOVE SURG ORTHO 8.0 STRL STRW (GLOVE) ×3 IMPLANT
GOWN STRL REUS W/ TWL LRG LVL3 (GOWN DISPOSABLE) ×3 IMPLANT
GOWN STRL REUS W/ TWL XL LVL3 (GOWN DISPOSABLE) ×3 IMPLANT
GOWN STRL REUS W/TWL LRG LVL3 (GOWN DISPOSABLE) ×2
GOWN STRL REUS W/TWL XL LVL3 (GOWN DISPOSABLE) ×5 IMPLANT
MANIFOLD NEPTUNE II (INSTRUMENTS) ×3 IMPLANT
NDL 1/2 CIR CATGUT .05X1.09 (NEEDLE) IMPLANT
NDL SCORPION MULTI FIRE (NEEDLE) IMPLANT
NEEDLE 1/2 CIR CATGUT .05X1.09 (NEEDLE) IMPLANT
NEEDLE SCORPION MULTI FIRE (NEEDLE) IMPLANT
NS IRRIG 1000ML POUR BTL (IV SOLUTION) ×3 IMPLANT
PACK ARTHROSCOPY DSU (CUSTOM PROCEDURE TRAY) ×3 IMPLANT
PACK BASIN DAY SURGERY FS (CUSTOM PROCEDURE TRAY) ×3 IMPLANT
PAD CLEANER CAUTERY TIP 5X5 (MISCELLANEOUS)
PAD ORTHO SHOULDER 7X19 LRG (SOFTGOODS) IMPLANT
PENCIL SMOKE EVACUATOR (MISCELLANEOUS) IMPLANT
PORT APPOLLO RF 90DEGREE MULTI (SURGICAL WAND) IMPLANT
RESTRAINT HEAD UNIVERSAL NS (MISCELLANEOUS) ×3 IMPLANT
SLEEVE SCD COMPRESS KNEE MED (STOCKING) ×3 IMPLANT
SLING ARM FOAM STRAP LRG (SOFTGOODS) IMPLANT
SLING ULTRA II MEDIUM (SOFTGOODS) IMPLANT
SLING ULTRA II SMALL (SOFTGOODS) IMPLANT
SPIKE FLUID TRANSFER (MISCELLANEOUS) IMPLANT
SPONGE T-LAP 4X18 ~~LOC~~+RFID (SPONGE) IMPLANT
STRIP CLOSURE SKIN 1/2X4 (GAUZE/BANDAGES/DRESSINGS) IMPLANT
SUCTION FRAZIER HANDLE 10FR (MISCELLANEOUS)
SUCTION TUBE FRAZIER 10FR DISP (MISCELLANEOUS) IMPLANT
SUT BONE WAX W31G (SUTURE) IMPLANT
SUT ETHILON 3 0 PS 1 (SUTURE) ×3 IMPLANT
SUT FIBERWIRE #2 38 T-5 BLUE (SUTURE)
SUT MNCRL AB 3-0 PS2 18 (SUTURE) IMPLANT
SUT TICRON 1 T 12 (SUTURE) IMPLANT
SUT TIGER TAPE 7 IN WHITE (SUTURE) IMPLANT
SUT VIC AB 0 CT1 27 (SUTURE)
SUT VIC AB 0 CT1 27XBRD ANBCTR (SUTURE) IMPLANT
SUT VIC AB 1 CT1 27 (SUTURE)
SUT VIC AB 1 CT1 27XBRD ANBCTR (SUTURE) IMPLANT
SUT VIC AB 2-0 SH 27 (SUTURE)
SUT VIC AB 2-0 SH 27XBRD (SUTURE) IMPLANT
SUTURE FIBERWR #2 38 T-5 BLUE (SUTURE) IMPLANT
TAPE FIBER 2MM 7IN #2 BLUE (SUTURE) IMPLANT
TOWEL GREEN STERILE FF (TOWEL DISPOSABLE) ×3 IMPLANT
TUBING ARTHROSCOPY IRRIG 16FT (MISCELLANEOUS) ×3 IMPLANT
WATER STERILE IRR 1000ML POUR (IV SOLUTION) ×3 IMPLANT
YANKAUER SUCT BULB TIP NO VENT (SUCTIONS) IMPLANT

## 2022-07-28 NOTE — Anesthesia Postprocedure Evaluation (Signed)
Anesthesia Post Note  Patient: Gabriel [name]  Procedure(s) Performed: SHOULDER ARTHROSCOPY WITH SUBACROMIAL DECOMPRESSION DISTAL CLAVICLE RESECTION (Right: Shoulder)     Patient location during evaluation: PACU Anesthesia Type: Regional and General Level of consciousness: awake and alert Pain management: pain level controlled Vital Signs Assessment: post-procedure vital signs reviewed and stable Respiratory status: spontaneous breathing, nonlabored ventilation and respiratory function stable Cardiovascular status: blood pressure returned to baseline and stable Postop Assessment: no apparent nausea or vomiting Anesthetic complications: no   No notable events documented.  Last Vitals:  Vitals:   07/28/22 1200 07/28/22 1235  BP: (!) 143/73 (!) 147/70  Pulse: 65 62  Resp: 15 18  Temp:  36.7 C  SpO2: 97% 97%    Last Pain:  Vitals:   07/28/22 1235  PainSc: 0-No pain                 Aviel Davalos

## 2022-07-28 NOTE — Interval H&P Note (Signed)
History and Physical Interval Note:  07/28/2022 7:36 AM  Gabriel [name]  has presented today for surgery, with the diagnosis of Chambers.  The various methods of treatment have been discussed with the patient and family. After consideration of risks, benefits and other options for treatment, the patient has consented to  Procedure(s): SHOULDER ARTHROSCOPY WITH  SUBACROMIAL DECOMPRESSION, DISTAL CLAVICLE EXCISION VS OPEN DISTAL CLAVICLECTOMY, POSSIBLE OPEN ROTATOR CUFF REPAIR (Right) as a surgical intervention.  [name] history has been reviewed, patient examined, no change in status, stable for surgery.  I have reviewed the patient's chart and labs.  Questions were answered to the patient's satisfaction.     Yvette Rack

## 2022-07-28 NOTE — Anesthesia Procedure Notes (Signed)
Anesthesia Regional Block: Interscalene brachial plexus block   Pre-Anesthetic Checklist: , timeout performed,  Correct Patient, Correct Site, Correct Laterality,  Correct Procedure, Correct Position, site marked,  Risks and benefits discussed,  Surgical consent,  Pre-op evaluation,  At surgeon's request and post-op pain management  Laterality: Right and Upper  Prep: chloraprep       Needles:  Injection technique: Single-shot      Needle Length: 5cm  Needle Gauge: 22     Additional Needles: Arrow StimuQuik ECHO Echogenic Stimulating PNB Needle  Procedures:,,,, ultrasound used (permanent image in chart),,    Narrative:  Start time: 07/28/2022 9:51 AM End time: 07/28/2022 9:55 AM Injection made incrementally with aspirations every 5 mL.  Performed by: Personally  Anesthesiologist: Oleta Mouse, MD

## 2022-07-28 NOTE — Transfer of Care (Signed)
Immediate Anesthesia Transfer of Care Note  Patient: Gabriel [name]  Procedure(s) Performed: SHOULDER ARTHROSCOPY WITH SUBACROMIAL DECOMPRESSION DISTAL CLAVICLE RESECTION (Right: Shoulder)  Patient Location: PACU  Anesthesia Type:General  Level of Consciousness: awake, alert , oriented and patient cooperative  Airway & Oxygen Therapy: Patient Spontanous Breathing and Patient connected to face mask oxygen  Post-op Assessment: Report given to RN and Post -op Vital signs reviewed and stable  Post vital signs: Reviewed and stable  Last Vitals:  Vitals Value Taken Time  BP 155/93 07/28/22 1133  Temp    Pulse 73 07/28/22 1134  Resp 14 07/28/22 1134  SpO2 100 % 07/28/22 1134  Vitals shown include unvalidated device data.  Last Pain:  Vitals:   07/28/22 0752  PainSc: 8       Patients Stated Pain Goal: 8 (33/00/76 2263)  Complications: No notable events documented.

## 2022-07-28 NOTE — Brief Op Note (Signed)
07/28/2022  11:23 AM  PATIENT:  Gabriel [name]  66 y.o. male  PRE-OPERATIVE DIAGNOSIS:  RIGHT SHOULDER ROTATOR CUFF TEAR  POST-OPERATIVE DIAGNOSIS:  RIGHT SHOULDER ROTATOR CUFF TEAR  PROCEDURE:  Procedure(s): SHOULDER ARTHROSCOPY WITH SUBACROMIAL DECOMPRESSION DISTAL CLAVICLE RESECTION (Right)  SURGEON:  Surgeon(s) and Role:    Earlie Server, MD - Primary  PHYSICIAN ASSISTANT: Chriss Czar, PA-C  ASSISTANTS:    ANESTHESIA:   local and regional  EBL:  5 mL   BLOOD ADMINISTERED:none  DRAINS: none   LOCAL MEDICATIONS USED:  NONE  SPECIMEN:  No Specimen  DISPOSITION OF SPECIMEN:  N/A  COUNTS:  YES  TOURNIQUET:  * No tourniquets in log *  DICTATION: .Other Dictation: Dictation Number unknown  PLAN OF CARE: Discharge to home after PACU  PATIENT DISPOSITION:  PACU - hemodynamically stable.   Delay start of Pharmacological VTE agent (>24hrs) due to surgical blood loss or risk of bleeding: N/A

## 2022-07-28 NOTE — Discharge Instructions (Addendum)
Diet: As you were doing prior to hospitalization   Activity: Increase activity slowly as tolerated  No lifting or driving for 2 weeks 02-40 hrs in sling then wean out as tolerated, range of motion as tolerated.  Shower: May shower without a dressing on post op day #3, NO SOAKING in tub   Dressing: You may change your dressing on post op day #3.  Then change the dressing daily with sterile 4"x4"s gauze dressing  Or band aids.  Weight Bearing: weight bearing as tolerated.  To prevent constipation: you may use a stool softener such as -  Colace ( over the counter) 100 mg by mouth twice a day  Drink plenty of fluids ( prune juice may be helpful) and high fiber foods  Miralax ( over the counter) for constipation as needed.   Precautions: If you experience chest pain or shortness of breath - call 911 immediately For transfer to the hospital emergency department!!  If you develop a fever greater that 101 F, purulent drainage from wound, increased redness or drainage from wound, or calf pain -- Call the office   Follow- Up Appointment: Please call for an appointment to be seen in 2 weeks      Tylenol may be taken at 2p if needed  Information for Discharge Teaching: EXPAREL (bupivacaine liposome injectable suspension)   Your surgeon or anesthesiologist gave you EXPAREL(bupivacaine) to help control your pain after surgery.  EXPAREL is a local anesthetic that provides pain relief by numbing the tissue around the surgical site. EXPAREL is designed to release pain medication over time and can control pain for up to 72 hours. Depending on how you respond to EXPAREL, you may require less pain medication during your recovery.  Possible side effects: Temporary loss of sensation or ability to move in the area where bupivacaine was injected. Nausea, vomiting, constipation Rarely, numbness and tingling in your mouth or lips, lightheadedness, or anxiety may occur. Call your doctor right away if  you think you may be experiencing any of these sensations, or if you have other questions regarding possible side effects.  Follow all other discharge instructions given to you by your surgeon or nurse. Eat a healthy diet and drink plenty of water or other fluids.  If you return to the hospital for any reason within 96 hours following the administration of EXPAREL, it is important for health care providers to know that you have received this anesthetic. A teal colored band has been placed on your arm with the date, time and amount of EXPAREL you have received in order to alert and inform your health care providers. Please leave this armband in place for the full 96 hours following administration, and then you may remove the band.  [place] - [phone]   Regional Anesthesia Blocks  1. Numbness or the inability to move the "blocked" extremity may last from 3-48 hours after placement. The length of time depends on the medication injected and your individual response to the medication. If the numbness is not going away after 48 hours, call your surgeon.  2. The extremity that is blocked will need to be protected until the numbness is gone and the  Strength has returned. Because you cannot feel it, you will need to take extra care to avoid injury. Because it may be weak, you may have difficulty moving it or using it. You may not know what position it is in without looking at it while the block is in effect.  3. For  blocks in the legs and feet, returning to weight bearing and walking needs to be done carefully. You will need to wait until the numbness is entirely gone and the strength has returned. You should be able to move your leg and foot normally before you try and bear weight or walk. You will need someone to be with you when you first try to ensure you do not fall and possibly risk injury.  4. Bruising and tenderness at the needle site are common side effects and will resolve in a few  days.  5. Persistent numbness or new problems with movement should be communicated to the surgeon or the [place] [phone] [place] [phone]).  Post Anesthesia Home Care Instructions  Activity: Get plenty of rest for the remainder of the day. A responsible individual must stay with you for 24 hours following the procedure.  For the next 24 hours, DO NOT: -Drive a car -Paediatric nurse -Drink alcoholic beverages -Take any medication unless instructed by your physician -Make any legal decisions or sign important papers.  Meals: Start with liquid foods such as gelatin or soup. Progress to regular foods as tolerated. Avoid greasy, spicy, heavy foods. If nausea and/or vomiting occur, drink only clear liquids until the nausea and/or vomiting subsides. Call your physician if vomiting continues.  Special Instructions/Symptoms: Your throat may feel dry or sore from the anesthesia or the breathing tube placed in your throat during surgery. If this causes discomfort, gargle with warm salt water. The discomfort should disappear within 24 hours.  If you had a scopolamine patch placed behind your ear for the management of post- operative nausea and/or vomiting:  1. The medication in the patch is effective for 72 hours, after which it should be removed.  Wrap patch in a tissue and discard in the trash. Wash hands thoroughly with soap and water. 2. You may remove the patch earlier than 72 hours if you experience unpleasant side effects which may include dry mouth, dizziness or visual disturbances. 3. Avoid touching the patch. Wash your hands with soap and water after contact with the patch.

## 2022-07-28 NOTE — Op Note (Signed)
NAME: Gabriel [name], Gabriel [name] MEDICAL RECORD NO: [id number] ACCOUNT NO: [id number] DATE OF BIRTH: [birth year] FACILITY: [facility] LOCATION: [facility] PHYSICIAN: W D. Valeta Harms., MD  Operative Report   DATE OF PROCEDURE: 07/28/2022  PREOPERATIVE DIAGNOSES: 1.  Partial rotator cuff tear. 2.  Impingement. 3.  AC joint arthritis. 4.  Degenerative tearing of the anterior superior labrum.  POSTOPERATIVE DIAGNOSES:   1.  Partial rotator cuff tear. 2.  Impingement. 3.  AC joint arthritis. 4.  Degenerative tearing of the anterior superior labrum.  OPERATION:   1.  Arthroscopic debridement (extensive). 2.  Arthroscopic acromioplasty. 3.  Arthroscopic distal clavicle excision, all for the right shoulder.  SURGEON: Yvette Rack., MD  ANESTHESIA:  General with block.  COMPLICATIONS:  No complications.  DESCRIPTION OF PROCEDURE:  Beachchair position.  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  Moderate degenerative tearing of the anterior superior labrum was encountered was debrided.  The biceps tendon anchor was intact despite MRI findings of a significant  articular-sided tear that there was minimal tearing on the undersurface of the cuff, basically essentially normal undersurface of the cuff. Intraarticular debridement was carried out.  Subacromial space was approached, did inflame.  We did a bursectomy,  very prominent CA ligament edge of the acromion with some abrasion type phenomenon on the cuff noted.  We released the CA ligament, did an acromioplasty.  Severe AC arthritis was encountered with some impingement on the cuff from a rounded arthritic  clavicle.  We excised about a centimeter to 1.5 cm of the distal clavicle to decompress the AC joint.  No full-thickness  tear appreciated.  Portals closed with nylon.  Taken to recovery room in stable condition.   SHY D: 07/28/2022 11:24:25 am T: 07/28/2022 12:52:00 pm  JOB: [id number]/ [id number]

## 2022-07-28 NOTE — Progress Notes (Signed)
Assisted Dr. Ermalene Postin with right, interscalene , ultrasound guided block. Side rails up, monitors on throughout procedure. See vital signs in flow sheet. Tolerated Procedure well.

## 2022-07-28 NOTE — Anesthesia Preprocedure Evaluation (Addendum)
Anesthesia Evaluation  Patient identified by MRN, date of birth, ID band Patient awake    Reviewed: Allergy & Precautions, NPO status , Patient's Chart, lab work & pertinent test results  History of Anesthesia Complications Negative for: history of anesthetic complications  Airway Mallampati: II  TM Distance: >3 FB Neck ROM: Full    Dental  (+) Dental Advisory Given, Partial Upper,    Pulmonary neg shortness of breath, neg COPD, neg recent URI, Current Smoker and Patient abstained from smoking.,    breath sounds clear to auscultation       Cardiovascular hypertension, Pt. on medications (-) angina+ Peripheral Vascular Disease  (-) Past MI  Rhythm:Regular     Neuro/Psych  Headaches, negative psych ROS   GI/Hepatic negative GI ROS, Neg liver ROS,   Endo/Other  diabetesNo results found for: "HGBA1C"   Renal/GU negative Renal ROS     Musculoskeletal negative musculoskeletal ROS (+)   Abdominal   Peds  Hematology negative hematology ROS (+) Lab Results      Component                Value               Date                      WBC                      7.5                 04/04/2022                HGB                      13.1                04/04/2022                HCT                      38.5 (L)            04/04/2022                MCV                      89.3                04/04/2022                PLT                      257                 04/04/2022              Anesthesia Other Findings   Reproductive/Obstetrics                           Anesthesia Physical Anesthesia Plan  ASA: 2  Anesthesia Plan: General and Regional   Post-op Pain Management: Tylenol PO (pre-op)* and Regional block*   Induction: Intravenous  PONV Risk Score and Plan: 1 and Ondansetron and Dexamethasone  Airway Management Planned: Oral ETT  Additional Equipment: None  Intra-op Plan:    Post-operative Plan: Extubation in OR  Informed Consent: I have reviewed the patients History and  Physical, chart, labs and discussed the procedure including the risks, benefits and alternatives for the proposed anesthesia with the patient or authorized representative who has indicated his/her understanding and acceptance.     Dental advisory given  Plan Discussed with: CRNA  Anesthesia Plan Comments:        Anesthesia Quick Evaluation

## 2022-07-28 NOTE — Anesthesia Procedure Notes (Signed)
Procedure Name: Intubation Date/Time: 07/28/2022 10:14 AM  Performed by: Garrel Ridgel, CRNAPre-anesthesia Checklist: Patient identified, Emergency Drugs available, Suction available and Patient being monitored Patient Re-evaluated:Patient Re-evaluated prior to induction Oxygen Delivery Method: Circle system utilized Preoxygenation: Pre-oxygenation with 100% oxygen Induction Type: IV induction Ventilation: Mask ventilation without difficulty Grade View: Grade I Tube type: Oral Number of attempts: 1 Airway Equipment and Method: Stylet Placement Confirmation: ETT inserted through vocal cords under direct vision, positive ETCO2 and breath sounds checked- equal and bilateral Secured at: 23 cm Tube secured with: Tape Dental Injury: Teeth and Oropharynx as per pre-operative assessment

## 2022-07-31 ENCOUNTER — Encounter (HOSPITAL_BASED_OUTPATIENT_CLINIC_OR_DEPARTMENT_OTHER): Payer: Self-pay | Admitting: Orthopedic Surgery

## 2022-07-31 NOTE — Addendum Note (Signed)
Addendum  created 07/31/22 0756 by Tawni Millers, CRNA   Charge Capture section accepted

## 2022-08-01 ENCOUNTER — Ambulatory Visit: Payer: BC Managed Care – PPO | Admitting: Cardiovascular Disease

## 2022-08-07 ENCOUNTER — Telehealth: Payer: Self-pay | Admitting: Cardiovascular Disease

## 2022-08-07 MED ORDER — CLOPIDOGREL BISULFATE 75 MG PO TABS: 75.0000 mg | ORAL_TABLET | Freq: Every day | ORAL | 6 refills | Status: DC

## 2022-08-07 NOTE — Telephone Encounter (Signed)
*  STAT* If patient is at the pharmacy, call can be transferred to refill team.   1. Which medications need to be refilled? (please list name of each medication and dose if known) Clopidogrel  2. Which pharmacy/location (including street and city if local pharmacy) is medication to be sent to? [facility]  [place] ,[place]  [address]  3. Do they need a 30 day or 90 day supply? 90 days and refills

## 2022-08-15 ENCOUNTER — Ambulatory Visit (HOSPITAL_COMMUNITY)
Admission: RE | Admit: 2022-08-15 | Discharge: 2022-08-15 | Disposition: A | Discharge status: Home / Self Care | Payer: BC Managed Care – PPO | Source: Ambulatory Visit | Attending: Cardiology | Admitting: Cardiology

## 2022-08-15 DIAGNOSIS — I739 Peripheral vascular disease, unspecified: Secondary | ICD-10-CM | POA: Diagnosis not present

## 2022-08-17 ENCOUNTER — Other Ambulatory Visit: Payer: Self-pay | Admitting: *Deleted

## 2022-08-17 ENCOUNTER — Encounter: Payer: Self-pay | Admitting: *Deleted

## 2022-08-17 DIAGNOSIS — I739 Peripheral vascular disease, unspecified: Secondary | ICD-10-CM

## 2022-08-21 NOTE — Progress Notes (Unsigned)
Cardiology Office Note   Date:  08/22/2022   ID:  Gabriel [name], DOB 04/21/1956, MRN [id number]  PCP:  Bernerd Limbo, MD  Cardiologist:   [name], MD   No chief complaint on file.     History of Present Illness: Gabriel [name] is a 66 y.o. male who is here today for follow-up visit regarding peripheral arterial disease and uncontrolled hypertension.   He has no previous cardiac history.  He has known history of essential hypertension, borderline diabetes and previous bladder cancer.  He smokes cigars daily.  He reports family history of coronary artery disease. He was seen by [facility] cardiology in 2022 for atypical chest pain.  He underwent a treadmill nuclear stress test which showed no evidence of ischemia. He was seen in February of this year for severe right calf claudication and numbness in both feet.  He underwent noninvasive vascular studies which showed an ABI of 0.63 on the right and 0.88 on the left.  Duplex showed severe stenosis in the right distal SFA and moderate left SFA disease.    I proceeded with angiography in February which showed no significant renal artery stenosis, no significant aortoiliac disease and severe subtotal occlusion of the right SFA with significant below the knee disease.  I performed directional arthrectomy and drug-coated balloon angioplasty to the right SFA.   He is known to have refractory hypertension with no evidence of renal artery stenosis.  During last visit, I switched him from clonidine to spironolactone.  In addition, a home sleep study was requested.  In addition, his blood pressure improved after he resumed his outpatient olmesartan-amlodipine-hydrochlorothiazide.   He reported worsening left calf claudication.  He had repeat Doppler studies done last week which showed an ABI of 0.80 on the right and 0.84 on the left.  Duplex showed patent right SFA with normal velocities.  There was moderate stenosis in the right TP trunk.  He reports  mild left calf claudication but has not done much walking since he had right shoulder surgery.  No chest pain or shortness of breath.   Past Medical History:  Diagnosis Date   Bladder cancer ([place])    Diabetes mellitus without complication ([place]) 36/62/9476   borderline- not taking prescribed med   Headache(784.0) 08/11/1989   not current problem   Hyperlipidemia    Hypertension    PAD (peripheral artery disease) ([place]) 2023   right arthrectomy, balloon angioplasty rt SFA    Past Surgical History:  Procedure Laterality Date   ABDOMINAL AORTOGRAM W/LOWER EXTREMITY N/A 01/25/2022   Procedure: ABDOMINAL AORTOGRAM W/LOWER EXTREMITY;  Surgeon: Wellington Hampshire, MD;  Location: [facility];  Service: Cardiovascular;  Laterality: N/A;   CYSTOSCOPY W/ URETERAL STENT PLACEMENT Bilateral 09/12/2013   Procedure: CYSTOSCOPY WITH BILATERAL  RETROGRADE PYELOGRAM  ;  Surgeon: Alexis Frock, MD;  Location: [facility];  Service: Urology;  Laterality: Bilateral;   KNEE ARTHROSCOPY Right many years ago-   doesn't remember date   NASAL POLYP SURGERY  date unknown   PERIPHERAL VASCULAR ATHERECTOMY Right 01/25/2022   Procedure: PERIPHERAL VASCULAR ATHERECTOMY;  Surgeon: Wellington Hampshire, MD;  Location: [facility];  Service: Cardiovascular;  Laterality: Right;   RECTAL SURGERY  date unknown   SHOULDER ARTHROSCOPY WITH SUBACROMIAL DECOMPRESSION Right 07/28/2022   Procedure: SHOULDER ARTHROSCOPY WITH SUBACROMIAL DECOMPRESSION DISTAL CLAVICLE RESECTION;  Surgeon: Earlie Server, MD;  Location: [facility];  Service: Orthopedics;  Laterality: Right;  TRANSURETHRAL RESECTION OF BLADDER TUMOR WITH GYRUS (TURBT-GYRUS) N/A 09/12/2013   Procedure: TRANSURETHRAL RESECTION OF BLADDER TUMOR WITH GYRUS (TURBT-GYRUS);  Surgeon: Alexis Frock, MD;  Location[facility];  Service: Urology;  Laterality: N/A;  1 HR      Current Outpatient Medications  Medication  Sig Dispense Refill   acetaminophen (TYLENOL) 500 MG tablet Take 2 tablets (1,000 mg total) by mouth every 6 (six) hours as needed. 100 tablet 2   aspirin 81 MG EC tablet Take 81 mg by mouth daily.     clopidogrel (PLAVIX) 75 MG tablet Take 1 tablet (75 mg total) by mouth daily. 30 tablet 6   glyBURIDE-metformin (GLUCOVANCE) 2.5-500 MG tablet Take 1 tablet by mouth 2 (two) times daily.     methocarbamol (ROBAXIN) 500 MG tablet Take 1 tablet (500 mg total) by mouth every 6 (six) hours as needed for muscle spasms. 60 tablet 0   Olmesartan-amLODIPine-HCTZ 40-10-25 MG TABS Take 1 tablet by mouth daily. 90 tablet 2   oxyCODONE (OXY IR/ROXICODONE) 5 MG immediate release tablet Take one tab po q4-6hrs prn pain, may need 1-2 first couple weeks 40 tablet 0   spironolactone (ALDACTONE) 25 MG tablet Take 1 tablet (25 mg total) by mouth daily. 90 tablet 1   traMADol (ULTRAM) 50 MG tablet Take 50 mg by mouth every 6 (six) hours as needed.     atorvastatin (LIPITOR) 20 MG tablet Take 1 tablet (20 mg total) by mouth daily. 90 tablet 3   No current facility-administered medications for this visit.    Allergies:   Patient has no known allergies.    Social History:  The patient  reports that he has been smoking cigars. He does not have any smokeless tobacco history on file. He reports that he does not drink alcohol and does not use drugs.   Family History:  [name] family history is remarkable for coronary artery disease and hypertension.   ROS:  Please see the history of present illness.   Otherwise, review of systems are positive for none.   All other systems are reviewed and negative.    PHYSICAL EXAM: VS:  BP (!) 150/78   Pulse 80   Ht '6\' 2"'$  (1.88 m)   Wt 173 lb (78.5 kg)   SpO2 99%   BMI 22.21 kg/m  , BMI Body mass index is 22.21 kg/m. GEN: Well nourished, well developed, in no acute distress  HEENT: normal  Neck: no JVD,  or masses.  Right carotid bruit. Cardiac: RRR; no murmurs,  rubs, or gallops,no edema  Respiratory:  clear to auscultation bilaterally, normal work of breathing GI: soft, nontender, nondistended, + BS MS: no deformity or atrophy  Skin: warm and dry, no rash Neuro:  Strength and sensation are intact Psych: euthymic mood, full affect Vascular: Femoral pulses are normal bilaterally.    EKG:  EKG is not ordered today.    Recent Labs: 04/04/2022: Hemoglobin 13.1; Platelets 257 04/18/2022: ALT 20 07/27/2022: BUN 28; Creatinine, Ser 1.36; Potassium 4.1; Sodium 139    Lipid Panel    Component Value Date/Time   CHOL 118 04/18/2022 0840   TRIG 394 (H) 04/18/2022 0840   HDL 28 (L) 04/18/2022 0840   CHOLHDL 4.2 04/18/2022 0840   LDLCALC 33 04/18/2022 0840      Wt Readings from Last 3 Encounters:  08/22/22 173 lb (78.5 kg)  07/28/22 176 lb 5.9 oz (80 kg)  05/16/22 186 lb (84.4 kg)  No data to display            ASSESSMENT AND PLAN:  1.  Peripheral arterial disease: Status post right SFA directional atherectomy and drug-coated balloon angioplasty.  The patient does have residual below the knee disease for which I recommend aggressive medical therapy for now unless symptoms worsen.  I discussed the results of recent Doppler studies with him.  He does complain of left calf claudication but does not seem to be severe and not lifestyle limiting.  I discussed with him the importance of a daily walking program and provided him with instructions.    2.  Moderate right carotid stenosis: The patient was found to have right carotid bruit and subsequent carotid Doppler showed 40 to 59% stenosis in the right carotid artery.  Repeat study in February 2024.  Continue aggressive treatment of risk factors.  3.  Hyperlipidemia: Continue treatment with atorvastatin.  Repeat lipid profile showed improvement in LDL which was down to 33.  4.  Refractory hypertension: Blood pressure is reasonably controlled on current medications.  5.  Cigar use: I  discussed with him the importance of cessation.  6.  Loud snoring: I requested a home sleep study but he had to reschedule the appointment.    Disposition: Follow-up in 6 months.  Signed,  [name], MD  08/22/2022 8:40 AM    [place]

## 2022-08-22 ENCOUNTER — Ambulatory Visit: Payer: BC Managed Care – PPO | Attending: Cardiovascular Disease | Admitting: Cardiovascular Disease

## 2022-08-22 ENCOUNTER — Encounter: Payer: Self-pay | Admitting: Cardiovascular Disease

## 2022-08-22 VITALS — BP 150/78 | HR 80 | Ht 74.0 in | Wt 173.0 lb

## 2022-08-22 DIAGNOSIS — E785 Hyperlipidemia, unspecified: Secondary | ICD-10-CM

## 2022-08-22 DIAGNOSIS — Z720 Tobacco use: Secondary | ICD-10-CM

## 2022-08-22 DIAGNOSIS — I739 Peripheral vascular disease, unspecified: Secondary | ICD-10-CM

## 2022-08-22 DIAGNOSIS — I779 Disorder of arteries and arterioles, unspecified: Secondary | ICD-10-CM | POA: Diagnosis not present

## 2022-08-22 NOTE — Patient Instructions (Addendum)
Medication Instructions:  Your physician recommends that you continue on your current medications as directed. Please refer to the Current Medication list given to you today.    Lab Work: None   Testing/Procedures: None   Follow-Up: At [facility], you and your health needs are our priority.  As part of our continuing mission to provide you with exceptional heart care, we have created designated Provider Care Teams.  These Care Teams include your primary Cardiologist (physician) and Advanced Practice Providers (APPs -  Physician Assistants and Nurse Practitioners) who all work together to provide you with the care you need, when you need it.  We recommend signing up for the patient portal called "MyChart".  Sign up information is provided on this After Visit Summary.  MyChart is used to connect with patients for Virtual Visits (Telemedicine).  Patients are able to view lab/test results, encounter notes, upcoming appointments, etc.  Non-urgent messages can be sent to your provider as well.   To learn more about what you can do with MyChart, go to [email or web address].    Your next appointment:   6 month(s)  The format for your next appointment:   In Person  Provider:   [name], MD     Other Instructions   Important Information About Sugar         EXERCISE PROGRAM FOR INDIVIDUALS WITH  PERIPHERAL ARTERIAL DISEASE (PAD)   General Information:   Research in vascular exercise has demonstrated remarkable improvement in symptoms of leg pain (claudication) without expensive or invasive interventions. Regular walking programs are extremely helpful for patients with PAD and intermittent claudication.  These steps are designed to help you get started with a safe and effective program to help you walk farther with less pain:   Walk at least three times a week (preferably every day).  Your goal is to build up to 30-45 minutes of total walking time (not counting  rest breaks). It may take you several weeks to build up your exercise time starting at 5-10 minutes or whatever you can tolerate.  Walk as far as possible using moderate to maximal pain (7-8 on the scale below) as a signal to stop, and resume walking when the pain goes away.  On a treadmill, set the speed and grade at a level that brings on the claudication pain within 3 to 5 minutes. Walk at this rate until you experience claudication of moderate severity, rest until the pain improves, and then resume walking.  Over time, you will be able to walk longer at the designated speed and grade; workload should then be increased until you develop the pain within 3 to 5 minutes once again.  This regimen will induce a significant benefit. Studies have demonstrated that participants may be able to walk up to three or four times farther and have less leg pain, within twelve weeks, by following this protocol.  Pain Scale    0_____1_____2_____3_____4_____5_____6_____7_____8_____9_____10   No Pain                                   Moderate Pain                               Maximal Pain

## 2022-09-01 ENCOUNTER — Telehealth: Payer: Self-pay | Admitting: Cardiovascular Disease

## 2022-09-01 NOTE — Telephone Encounter (Signed)
   Pre-operative Risk Assessment    Patient Name: Gabriel [name]  DOB: December 20, 1955 MRN: [id number]{     Request for Surgical Clearance    Procedure:   GI PROCEDURE  Date of Surgery:  Clearance 09/20/22                               Surgeon:  DR Shary Key Surgeon's Group or Practice Name:  [facility] Phone number:  [phone] Fax number:  [phone]  Type of Clearance Requested:   - Pharmacy:  Hold Clopidogrel (Plavix) 4 DAYS PRIOR Not Indicated   Additional requests/questions:    Signed, Eli Phillips   09/01/2022, 7:25 AM

## 2022-09-04 NOTE — Telephone Encounter (Signed)
Hold Plavix 5 days before.  °

## 2022-09-05 NOTE — Telephone Encounter (Signed)
   Patient Name: [name]  DOB: Jun 03, 1956 MRN: [id number]  Primary Cardiologist: [name], MD  Chart reviewed as part of pre-operative protocol coverage. [name] may hold PLavix 5 days prior to planned procedure. Pharmacy clearance only requested.   I will route this recommendation to the requesting party via Epic fax function and remove from pre-op pool.  Please call with questions.  Loel Dubonnet, NP 09/05/2022, 5:08 PM

## 2022-10-02 ENCOUNTER — Telehealth: Payer: Self-pay | Admitting: Cardiovascular Disease

## 2022-10-02 NOTE — Telephone Encounter (Signed)
Patient was sent to [facility] on 4/26-4/27 and would like a note for his employer stating that he was admitted into the hospital. If you have any questions please give him a call back at [phone]

## 2022-10-04 NOTE — Telephone Encounter (Signed)
Note was completed by Coletta Memos, NP in April. See letters tabs.   This has been reprinted for the patient and left up front for him. Patient is aware.

## 2022-10-04 NOTE — Telephone Encounter (Signed)
There is a note from you on 4/26 that he was given a work note.

## 2022-11-30 ENCOUNTER — Telehealth: Payer: Self-pay | Admitting: Cardiovascular Disease

## 2022-11-30 NOTE — Telephone Encounter (Signed)
The patient stated [employer] never got the forms that were faxed to them in May of 2023. He has been advised that we will resend the copies that he dropped off.

## 2022-11-30 NOTE — Telephone Encounter (Signed)
Pt states [employer] has faxed over FMLA paperwork to our office and they haven't heard anything yet.   Pt states he is at work and ask if someone could call after 4pm

## 2022-12-01 NOTE — Telephone Encounter (Signed)
Forms have been faxed to [facility] and mailed to the patient

## 2022-12-07 ENCOUNTER — Telehealth: Payer: Self-pay

## 2022-12-07 NOTE — Telephone Encounter (Signed)
Pia Mau KeyKatharina Caper - Rx #: [id number] Need help? Call us at [phone]  Drug Olmesartan-amLODIPine-HCTZ 40-10-'25MG'$  tablets Form [employer] Electronic PA Form (2017 NCPDP) Outcome Additional Information Required Drug is covered by current benefit plan. No further PA activity needed

## 2023-01-01 ENCOUNTER — Other Ambulatory Visit: Payer: Self-pay | Admitting: Cardiovascular Disease

## 2023-01-01 NOTE — Telephone Encounter (Signed)
Refill request

## 2023-01-30 ENCOUNTER — Ambulatory Visit (HOSPITAL_COMMUNITY)
Admission: RE | Admit: 2023-01-30 | Discharge: 2023-01-30 | Disposition: A | Discharge status: Home / Self Care | Payer: BC Managed Care – PPO | Source: Ambulatory Visit | Attending: Cardiovascular Disease | Admitting: Cardiovascular Disease

## 2023-01-30 DIAGNOSIS — R0989 Other specified symptoms and signs involving the circulatory and respiratory systems: Secondary | ICD-10-CM | POA: Insufficient documentation

## 2023-02-01 ENCOUNTER — Other Ambulatory Visit: Payer: Self-pay | Admitting: *Deleted

## 2023-02-01 DIAGNOSIS — I779 Disorder of arteries and arterioles, unspecified: Secondary | ICD-10-CM

## 2023-02-11 ENCOUNTER — Emergency Department (HOSPITAL_COMMUNITY)
Admission: EM | Admit: 2023-02-11 | Discharge: 2023-02-11 | Disposition: A | Discharge status: Home / Self Care | Payer: BC Managed Care – PPO | Attending: Emergency Medicine | Admitting: Emergency Medicine

## 2023-02-11 ENCOUNTER — Emergency Department (HOSPITAL_COMMUNITY): Payer: BC Managed Care – PPO

## 2023-02-11 ENCOUNTER — Other Ambulatory Visit: Payer: Self-pay

## 2023-02-11 ENCOUNTER — Encounter (HOSPITAL_COMMUNITY): Payer: Self-pay

## 2023-02-11 DIAGNOSIS — Z7902 Long term (current) use of antithrombotics/antiplatelets: Secondary | ICD-10-CM | POA: Diagnosis not present

## 2023-02-11 DIAGNOSIS — Z7984 Long term (current) use of oral hypoglycemic drugs: Secondary | ICD-10-CM | POA: Diagnosis not present

## 2023-02-11 DIAGNOSIS — E119 Type 2 diabetes mellitus without complications: Secondary | ICD-10-CM | POA: Insufficient documentation

## 2023-02-11 DIAGNOSIS — I10 Essential (primary) hypertension: Secondary | ICD-10-CM | POA: Insufficient documentation

## 2023-02-11 DIAGNOSIS — Z79899 Other long term (current) drug therapy: Secondary | ICD-10-CM | POA: Insufficient documentation

## 2023-02-11 DIAGNOSIS — Z7982 Long term (current) use of aspirin: Secondary | ICD-10-CM | POA: Insufficient documentation

## 2023-02-11 DIAGNOSIS — R109 Unspecified abdominal pain: Secondary | ICD-10-CM | POA: Diagnosis not present

## 2023-02-11 DIAGNOSIS — Z8551 Personal history of malignant neoplasm of bladder: Secondary | ICD-10-CM | POA: Insufficient documentation

## 2023-02-11 LAB — URINALYSIS, ROUTINE W REFLEX MICROSCOPIC
Bilirubin Urine: NEGATIVE
Glucose, UA: NEGATIVE mg/dL
Hgb urine dipstick: NEGATIVE
Ketones, ur: NEGATIVE mg/dL
Leukocytes,Ua: NEGATIVE
Nitrite: NEGATIVE
Protein, ur: NEGATIVE mg/dL
Specific Gravity, Urine: 1.017 (ref 1.005–1.030)
pH: 6 (ref 5.0–8.0)

## 2023-02-11 LAB — COMPREHENSIVE METABOLIC PANEL
ALT: 18 U/L (ref 0–44)
AST: 19 U/L (ref 15–41)
Albumin: 3.5 g/dL (ref 3.5–5.0)
Alkaline Phosphatase: 63 U/L (ref 38–126)
Anion gap: 12 (ref 5–15)
BUN: 22 mg/dL (ref 8–23)
CO2: 23 mmol/L (ref 22–32)
Calcium: 8.9 mg/dL (ref 8.9–10.3)
Chloride: 105 mmol/L (ref 98–111)
Creatinine, Ser: 1.19 mg/dL (ref 0.61–1.24)
GFR, Estimated: >60 mL/min (ref >60)
Glucose, Bld: 115 mg/dL — ABNORMAL HIGH (ref 70–99)
Potassium: 3.2 mmol/L — ABNORMAL LOW (ref 3.5–5.1)
Sodium: 140 mmol/L (ref 135–145)
Total Bilirubin: 0.3 mg/dL (ref 0.3–1.2)
Total Protein: 6.7 g/dL (ref 6.5–8.1)

## 2023-02-11 LAB — CBC WITH DIFFERENTIAL/PLATELET
Abs Immature Granulocytes: 0.01 10*3/uL (ref 0.00–0.07)
Basophils Absolute: 0 10*3/uL (ref 0.0–0.1)
Basophils Relative: 0 %
Eosinophils Absolute: 0.1 10*3/uL (ref 0.0–0.5)
Eosinophils Relative: 1 %
HCT: 35.6 % — ABNORMAL LOW (ref 39.0–52.0)
Hemoglobin: 12.3 g/dL — ABNORMAL LOW (ref 13.0–17.0)
Immature Granulocytes: 0 %
Lymphocytes Relative: 30 %
Lymphs Abs: 2.4 10*3/uL (ref 0.7–4.0)
MCH: 31.2 pg (ref 26.0–34.0)
MCHC: 34.6 g/dL (ref 30.0–36.0)
MCV: 90.4 fL (ref 80.0–100.0)
Monocytes Absolute: 0.5 10*3/uL (ref 0.1–1.0)
Monocytes Relative: 6 %
Neutro Abs: 5.1 10*3/uL (ref 1.7–7.7)
Neutrophils Relative %: 63 %
Platelets: 201 10*3/uL (ref 150–400)
RBC: 3.94 MIL/uL — ABNORMAL LOW (ref 4.22–5.81)
RDW: 12.7 % (ref 11.5–15.5)
WBC: 8.1 10*3/uL (ref 4.0–10.5)
nRBC: 0 % (ref 0.0–0.2)

## 2023-02-11 LAB — LIPASE, BLOOD: Lipase: 38 U/L (ref 11–51)

## 2023-02-11 MED ORDER — KETOROLAC TROMETHAMINE 15 MG/ML IJ SOLN
15.0000 mg | Freq: Once | INTRAMUSCULAR | Status: AC
  Administered 2023-02-11: 15 mg via INTRAVENOUS
  Filled 2023-02-11: qty 1

## 2023-02-11 MED ORDER — HYDROMORPHONE HCL 1 MG/ML IJ SOLN
1.0000 mg | Freq: Once | INTRAMUSCULAR | Status: AC
  Administered 2023-02-11: 1 mg via INTRAVENOUS
  Filled 2023-02-11: qty 1

## 2023-02-11 MED ORDER — LACTATED RINGERS IV BOLUS
1000.0000 mL | Freq: Once | INTRAVENOUS | Status: AC
  Administered 2023-02-11: 1000 mL via INTRAVENOUS

## 2023-02-11 MED ORDER — POTASSIUM CHLORIDE CRYS ER 20 MEQ PO TBCR
40.0000 meq | EXTENDED_RELEASE_TABLET | Freq: Once | ORAL | Status: AC
  Administered 2023-02-11: 40 meq via ORAL
  Filled 2023-02-11: qty 2

## 2023-02-11 MED ORDER — DICYCLOMINE HCL 20 MG PO TABS: 20.0000 mg | ORAL_TABLET | Freq: Two times a day (BID) | ORAL | 0 refills | Status: DC

## 2023-02-11 MED ORDER — ONDANSETRON 4 MG PO TBDP: 4.0000 mg | ORAL_TABLET | Freq: Three times a day (TID) | ORAL | 0 refills | Status: DC | PRN

## 2023-02-11 MED ORDER — ONDANSETRON HCL 4 MG/2ML IJ SOLN
4.0000 mg | Freq: Once | INTRAMUSCULAR | Status: AC
  Administered 2023-02-11: 4 mg via INTRAVENOUS
  Filled 2023-02-11: qty 2

## 2023-02-11 NOTE — ED Triage Notes (Signed)
Ongoing RUQ abdominal pain that remains constant since early January. Says he has seen PCP and already had CT/ US/ and MRI with no definitive answer.   Encouraged to ER for pain control and evaluation.

## 2023-02-11 NOTE — Discharge Instructions (Addendum)
No concerning findings on your work up. Return to the ED for concerning symptoms. Follow up with your PCP. I have attached info for urology and GI clinics above for you. I have sent in zofran and bentyl to the pharmacy to use as needed.

## 2023-02-11 NOTE — ED Provider Notes (Addendum)
[facility] Provider Note   CSN: [id number] Arrival date & time: 02/11/23  0544     History  Chief Complaint  Patient presents with   Abdominal Pain    Gabriel [name] is a 67 y.o. male.  Patient presents with right sided abdominal pain ongoing for about a month. Has undergone workup with PCP including CT, [place], and MRI with no definitive findings with the exception of stone within kidney, and renal cyst. Unable to follow up with urologist until June. Denies N/V, or diarrhea. Without dysuria, hematuria. Pain does not radiate.  The history is provided by the patient. No language interpreter was used.       Home Medications Prior to Admission medications   Medication Sig Start Date End Date Taking? Authorizing Provider  acetaminophen (TYLENOL) 500 MG tablet Take 2 tablets (1,000 mg total) by mouth every 6 (six) hours as needed. 07/28/22 07/28/23  Chadwell, Vonna Kotyk, PA-C  aspirin 81 MG EC tablet Take 81 mg by mouth daily.    [provider]  atorvastatin (LIPITOR) 20 MG tablet TAKE 1 TABLET(20 MG) BY MOUTH DAILY 01/01/23   Wellington Hampshire, MD  clopidogrel (PLAVIX) 75 MG tablet TAKE 1 TABLET(75 MG) BY MOUTH DAILY 01/01/23   Wellington Hampshire, MD  glyBURIDE-metformin (GLUCOVANCE) 2.5-500 MG tablet Take 1 tablet by mouth 2 (two) times daily. 05/27/21   [provider]  methocarbamol (ROBAXIN) 500 MG tablet Take 1 tablet (500 mg total) by mouth every 6 (six) hours as needed for muscle spasms. 07/28/22   Chadwell, Vonna Kotyk, PA-C  Olmesartan-amLODIPine-HCTZ 40-10-25 MG TABS Take 1 tablet by mouth daily. 04/06/22   Wellington Hampshire, MD  oxyCODONE (OXY IR/ROXICODONE) 5 MG immediate release tablet Take one tab po q4-6hrs prn pain, may need 1-2 first couple weeks 07/28/22   Chadwell, Vonna Kotyk, PA-C  spironolactone (ALDACTONE) 25 MG tablet Take 1 tablet (25 mg total) by mouth daily. 04/11/22 10/08/22  Wellington Hampshire, MD  traMADol (ULTRAM) 50 MG  tablet Take 50 mg by mouth every 6 (six) hours as needed. 05/02/22   [provider]      Allergies    Patient has no known allergies.    Review of Systems   Review of Systems  Constitutional:  Negative for fever.  Cardiovascular:  Negative for chest pain.  Gastrointestinal:  Positive for abdominal pain. Negative for nausea and vomiting.  Genitourinary:  Negative for difficulty urinating, dysuria, flank pain and hematuria.  Neurological:  Negative for light-headedness.  All other systems reviewed and are negative.   Physical Exam Updated Vital Signs BP (!) 151/75 (BP Location: Left Arm)   Pulse 70   Temp 97.9 F (36.6 C) (Oral)   Resp 18   Ht '6\' 2"'$  (1.88 m)   Wt 79.4 kg   SpO2 100%   BMI 22.47 kg/m  Physical Exam Vitals and nursing note reviewed.  Constitutional:      General: He is not in acute distress.    Appearance: Normal appearance. He is not ill-appearing.  HENT:     Head: Normocephalic and atraumatic.     Nose: Nose normal.  Eyes:     General: No scleral icterus.    Extraocular Movements: Extraocular movements intact.     Conjunctiva/sclera: Conjunctivae normal.  Cardiovascular:     Rate and Rhythm: Normal rate and regular rhythm.     Pulses: Normal pulses.  Pulmonary:     Effort: Pulmonary effort is normal. No  respiratory distress.     Breath sounds: Normal breath sounds. No wheezing or rales.  Abdominal:     General: There is no distension.     Palpations: Abdomen is soft.     Tenderness: There is no abdominal tenderness. There is no guarding.  Musculoskeletal:        General: Normal range of motion.     Cervical back: Normal range of motion.  Skin:    General: Skin is warm and dry.  Neurological:     General: No focal deficit present.     Mental Status: He is alert. Mental status is at baseline.     ED Results / Procedures / Treatments   Labs (all labs ordered are listed, but only abnormal results are displayed) Labs Reviewed  CBC  WITH DIFFERENTIAL/PLATELET - Abnormal; Notable for the following components:      Result Value   RBC 3.94 (*)    Hemoglobin 12.3 (*)    HCT 35.6 (*)    All other components within normal limits  URINALYSIS, ROUTINE W REFLEX MICROSCOPIC  LIPASE, BLOOD  COMPREHENSIVE METABOLIC PANEL    EKG None  Radiology No results found.  Procedures Procedures    Medications Ordered in ED Medications  lactated ringers bolus 1,000 mL (has no administration in time range)  HYDROmorphone (DILAUDID) injection 1 mg (has no administration in time range)  ondansetron (ZOFRAN) injection 4 mg (has no administration in time range)  ketorolac (TORADOL) 15 MG/ML injection 15 mg (has no administration in time range)    ED Course/ Medical Decision Making/ A&P Clinical Course as of 02/11/23 0912  Nancy Fetter Feb 11, 2023  0912 Comprehensive metabolic panel(!) [AA]  0000000 CBC with Differential(!) [AA]    Clinical Course User Index [AA] Evlyn Courier, PA-C                             Medical Decision Making Amount and/or Complexity of Data Reviewed Labs: ordered. Decision-making details documented in ED Course. Radiology: ordered.  Risk Prescription drug management.   Medical Decision Making / ED Course   This patient presents to the ED for concern of abdominal pain, this involves an extensive number of treatment options, and is a complaint that carries with it a high risk of complications and morbidity.  The differential diagnosis includes nephrolithiasis, appendicitis, cholelithiasis, cholecystitis, pancreatitis, pyelo.   MDM: Patient with above complaints. VSS. Without acute distress. Without TTP on exam. Will obtain labs and imaging. Will provide pain control and fluids. Will reevaluate after work up and medications.  Improved on reeval. Work up reassuring and without acute pathology. Potassium repleted. K 3.2. no leukocytosis. Without UTI.  Discussed follow up with PCP. Urology referral given since  he can't see his urologist until June. GI info given.  Discharged in stable condition.  No indication for further work up. Recent outpatient CT, [place], and MRI also without acute pathology.   Additional history obtained: -Additional history obtained from care everywhere. Reviewed imaging.   -External records from outside source obtained and reviewed including: Chart review including previous notes, labs, imaging, consultation notes   Lab Tests: -I ordered, reviewed, and interpreted labs.   The pertinent results include:   Labs Reviewed  CBC WITH DIFFERENTIAL/PLATELET - Abnormal; Notable for the following components:      Result Value   RBC 3.94 (*)    Hemoglobin 12.3 (*)    HCT 35.6 (*)    All  other components within normal limits  URINALYSIS, ROUTINE W REFLEX MICROSCOPIC  LIPASE, BLOOD  COMPREHENSIVE METABOLIC PANEL      EKG  EKG Interpretation  Date/Time:    Ventricular Rate:    PR Interval:    QRS Duration:   QT Interval:    QTC Calculation:   R Axis:     Text Interpretation:           Imaging Studies ordered: I ordered imaging studies including ct renal stone study I independently visualized and interpreted imaging. I agree with the radiologist interpretation   Medicines ordered and prescription drug management: Meds ordered this encounter  Medications   lactated ringers bolus 1,000 mL   HYDROmorphone (DILAUDID) injection 1 mg   ondansetron (ZOFRAN) injection 4 mg   ketorolac (TORADOL) 15 MG/ML injection 15 mg    -I have reviewed the patients home medicines and have made adjustments as needed  Critical interventions Pain control and fluids  Reevaluation: After the interventions noted above, I reevaluated the patient and found that they have :improved  Co morbidities that complicate the patient evaluation  Past Medical History:  Diagnosis Date   Bladder cancer ([place])    Diabetes mellitus without complication ([place]) 123456   borderline- not  taking prescribed med   Headache(784.0) 08/11/1989   not current problem   Hyperlipidemia    Hypertension    PAD (peripheral artery disease) ([place]) 2023   right arthrectomy, balloon angioplasty rt SFA      Dispostion: Patient after work up is appropriate for discharge.   Final Clinical Impression(s) / ED Diagnoses Final diagnoses:  Abdominal pain, unspecified abdominal location    Rx / DC Orders ED Discharge Orders          Ordered    dicyclomine (BENTYL) 20 MG tablet  2 times daily        02/11/23 0857    ondansetron (ZOFRAN-ODT) 4 MG disintegrating tablet  Every 8 hours PRN        02/11/23 0857              Evlyn Courier, PA-C 02/11/23 0911    Evlyn Courier, PA-C 02/11/23 [id number]    Lajean Saver, MD 02/11/23 1219

## 2023-02-11 NOTE — ED Notes (Signed)
Pt to room from waiting room. Pt complains of right flank pain. Pt hooked up to monitors. Vitals stable. Call bell within reach. No other acute distress noted at this time.

## 2023-02-20 ENCOUNTER — Ambulatory Visit: Payer: BC Managed Care – PPO | Attending: Cardiovascular Disease | Admitting: Cardiovascular Disease

## 2023-02-20 ENCOUNTER — Encounter: Payer: Self-pay | Admitting: Cardiovascular Disease

## 2023-02-20 VITALS — BP 124/62 | HR 77 | Ht 74.0 in | Wt 166.0 lb

## 2023-02-20 DIAGNOSIS — I739 Peripheral vascular disease, unspecified: Secondary | ICD-10-CM | POA: Diagnosis not present

## 2023-02-20 DIAGNOSIS — E785 Hyperlipidemia, unspecified: Secondary | ICD-10-CM | POA: Diagnosis not present

## 2023-02-20 DIAGNOSIS — I6521 Occlusion and stenosis of right carotid artery: Secondary | ICD-10-CM | POA: Diagnosis not present

## 2023-02-20 DIAGNOSIS — I10 Essential (primary) hypertension: Secondary | ICD-10-CM

## 2023-02-20 NOTE — Progress Notes (Signed)
Cardiology Office Note   Date:  02/20/2023   ID:  Gabriel [name], DOB 03/30/1956, MRN [id number]  PCP:  Bernerd Limbo, MD  Cardiologist:   [name], MD   Chief Complaint  Patient presents with   Follow-up    6 months.      History of Present Illness: Gabriel [name] is a 67 y.o. male who is here today for follow-up visit regarding peripheral arterial disease and uncontrolled hypertension.   He has no previous cardiac history.  He has known history of essential hypertension, borderline diabetes and previous bladder cancer.  He smokes cigars daily.  He reports family history of coronary artery disease. He was seen by [facility] cardiology in 2022 for atypical chest pain.  He underwent a treadmill nuclear stress test which showed no evidence of ischemia. He was seen in February of 2023 for severe right calf claudication and numbness in both feet.  He underwent noninvasive vascular studies which showed an ABI of 0.63 on the right and 0.88 on the left.  Duplex showed severe stenosis in the right distal SFA and moderate left SFA disease.    Angiography in February 2023 showed no significant renal artery stenosis, no significant aortoiliac disease and severe subtotal occlusion of the right SFA with significant below the knee disease.  I performed directional arthrectomy and drug-coated balloon angioplasty to the right SFA.   He is known to have refractory hypertension with no evidence of renal artery stenosis.   He reported worsening left calf claudication.  He had repeat Doppler studies done last week which showed an ABI of 0.80 on the right and 0.84 on the left.  Duplex showed patent right SFA with normal velocities.  There was moderate stenosis in the right TP trunk.  He reported left calf claudication during last visit on I asked him to increase his walking exercise program.  However, he reports worsening symptoms and he is only able to walk a short distance before having left calf  claudication.  No symptoms on the right side.  This has significantly affected his ability to perform his job and work duties.  No chest pain or shortness of breath.  He had an emergency room visit recently for abdominal pain and was found to have a small kidney stone.   Past Medical History:  Diagnosis Date   Bladder cancer ([place])    Diabetes mellitus without complication ([place]) 123456   borderline- not taking prescribed med   Headache(784.0) 08/11/1989   not current problem   Hyperlipidemia    Hypertension    PAD (peripheral artery disease) ([place]) 2023   right arthrectomy, balloon angioplasty rt SFA    Past Surgical History:  Procedure Laterality Date   ABDOMINAL AORTOGRAM W/LOWER EXTREMITY N/A 01/25/2022   Procedure: ABDOMINAL AORTOGRAM W/LOWER EXTREMITY;  Surgeon: Wellington Hampshire, MD;  Location: [facility];  Service: Cardiovascular;  Laterality: N/A;   CYSTOSCOPY W/ URETERAL STENT PLACEMENT Bilateral 09/12/2013   Procedure: CYSTOSCOPY WITH BILATERAL  RETROGRADE PYELOGRAM  ;  Surgeon: Alexis Frock, MD;  Location: [facility];  Service: Urology;  Laterality: Bilateral;   KNEE ARTHROSCOPY Right many years ago-   doesn't remember date   NASAL POLYP SURGERY  date unknown   PERIPHERAL VASCULAR ATHERECTOMY Right 01/25/2022   Procedure: PERIPHERAL VASCULAR ATHERECTOMY;  Surgeon: Wellington Hampshire, MD;  Location: [facility];  Service: Cardiovascular;  Laterality: Right;   RECTAL SURGERY  date unknown   SHOULDER ARTHROSCOPY  WITH SUBACROMIAL DECOMPRESSION Right 07/28/2022   Procedure: SHOULDER ARTHROSCOPY WITH SUBACROMIAL DECOMPRESSION DISTAL CLAVICLE RESECTION;  Surgeon: Earlie Server, MD;  Location: [facility];  Service: Orthopedics;  Laterality: Right;   TRANSURETHRAL RESECTION OF BLADDER TUMOR WITH GYRUS (TURBT-GYRUS) N/A 09/12/2013   Procedure: TRANSURETHRAL RESECTION OF BLADDER TUMOR WITH GYRUS (TURBT-GYRUS);  Surgeon: Alexis Frock,  MD;  Location: [facility];  Service: Urology;  Laterality: N/A;  1 HR      Current Outpatient Medications  Medication Sig Dispense Refill   acetaminophen (TYLENOL) 500 MG tablet Take 2 tablets (1,000 mg total) by mouth every 6 (six) hours as needed. 100 tablet 2   aspirin 81 MG EC tablet Take 81 mg by mouth daily.     atorvastatin (LIPITOR) 20 MG tablet TAKE 1 TABLET(20 MG) BY MOUTH DAILY 90 tablet 1   clopidogrel (PLAVIX) 75 MG tablet TAKE 1 TABLET(75 MG) BY MOUTH DAILY 30 tablet 3   dicyclomine (BENTYL) 20 MG tablet Take 1 tablet (20 mg total) by mouth 2 (two) times daily. 20 tablet 0   glyBURIDE-metformin (GLUCOVANCE) 2.5-500 MG tablet Take 1 tablet by mouth 2 (two) times daily.     Olmesartan-amLODIPine-HCTZ 40-10-25 MG TABS Take 1 tablet by mouth daily. 90 tablet 2   ondansetron (ZOFRAN-ODT) 4 MG disintegrating tablet Take 1 tablet (4 mg total) by mouth every 8 (eight) hours as needed for nausea or vomiting. 20 tablet 0   spironolactone (ALDACTONE) 25 MG tablet Take 1 tablet (25 mg total) by mouth daily. 90 tablet 1   No current facility-administered medications for this visit.    Allergies:   Patient has no known allergies.    Social History:  The patient  reports that he has been smoking cigars. He does not have any smokeless tobacco history on file. He reports that he does not drink alcohol and does not use drugs.   Family History:  [name] family history is remarkable for coronary artery disease and hypertension.   ROS:  Please see the history of present illness.   Otherwise, review of systems are positive for none.   All other systems are reviewed and negative.    PHYSICAL EXAM: VS:  BP 124/62 (BP Location: Left Arm, Patient Position: Sitting, Cuff Size: Normal)   Pulse 77   Ht '6\' 2"'$  (1.88 m)   Wt 166 lb (75.3 kg)   BMI 21.31 kg/m  , BMI Body mass index is 21.31 kg/m. GEN: Well nourished, well developed, in no acute distress  HEENT: normal   Neck: no JVD,  or masses.  Right carotid bruit. Cardiac: RRR; no murmurs, rubs, or gallops,no edema  Respiratory:  clear to auscultation bilaterally, normal work of breathing GI: soft, nontender, nondistended, + BS MS: no deformity or atrophy  Skin: warm and dry, no rash Neuro:  Strength and sensation are intact Psych: euthymic mood, full affect Vascular: Femoral pulses are normal bilaterally.    EKG:  EKG is ordered today. EKG sinus rhythm with inferior lateral T wave changes suggestive of ischemia.   Recent Labs: 02/11/2023: ALT 18; BUN 22; Creatinine, Ser 1.19; Hemoglobin 12.3; Platelets 201; Potassium 3.2; Sodium 140    Lipid Panel    Component Value Date/Time   CHOL 118 04/18/2022 0840   TRIG 394 (H) 04/18/2022 0840   HDL 28 (L) 04/18/2022 0840   CHOLHDL 4.2 04/18/2022 0840   LDLCALC 33 04/18/2022 0840      Wt Readings from Last 3 Encounters:  02/20/23 166  lb (75.3 kg)  02/11/23 175 lb (79.4 kg)  08/22/22 173 lb (78.5 kg)           No data to display            ASSESSMENT AND PLAN:  1.  Peripheral arterial disease: Status post right SFA directional atherectomy and drug-coated balloon angioplasty.  The patient does have residual below the knee disease on the right but has no symptoms there.   He now returns with worsening severe left calf claudication (Rutherford class III) and spite of a walking program.  This is significantly affecting his ability to perform his work.  I discussed with him different management options and given that he failed medical therapy and a walking program, I recommend proceeding with angiography and possible endovascular intervention. I discussed the procedure in details as well as risk and benefits.  2.  Moderate right carotid stenosis: He has stable 40 to 59% stenosis in the right carotid artery most recently checked in February.  Repeat study in 1 year.  Continue treatment of risk factors.  3.  Hyperlipidemia: Continue treatment  with atorvastatin.  Repeat lipid profile showed improvement in LDL which was down to 33.  4.  Refractory hypertension: Blood pressure is well controlled on current medications.  5.  Cigar use: I discussed with him the importance of cessation.    Disposition: Proceed with angiography in April and follow-up after.  Signed,  [name], MD  02/20/2023 8:39 AM    [place]

## 2023-02-20 NOTE — Patient Instructions (Signed)
Medication Instructions:  No changes *If you need a refill on your cardiac medications before your next appointment, please call your pharmacy*   Testing/Procedures: Your physician has requested that you have a peripheral vascular angiogram. This exam is performed at the hospital. During this exam IV contrast is used to look at arterial blood flow. Please review the information sheet given for details.   Follow-Up: At [facility], you and your health needs are our priority.  As part of our continuing mission to provide you with exceptional heart care, we have created designated Provider Care Teams.  These Care Teams include your primary Cardiologist (physician) and Advanced Practice Providers (APPs -  Physician Assistants and Nurse Practitioners) who all work together to provide you with the care you need, when you need it.  We recommend signing up for the patient portal called "MyChart".  Sign up information is provided on this After Visit Summary.  MyChart is used to connect with patients for Virtual Visits (Telemedicine).  Patients are able to view lab/test results, encounter notes, upcoming appointments, etc.  Non-urgent messages can be sent to your provider as well.   To learn more about what you can do with MyChart, go to [email or web address].    Your next appointment:   Keep your post procedure follow up on 5/17 at 3:35 Other [facility] A DEPT [place] [id number] [place] [id number] Dept: [phone] Loc: [place]  02/20/2023  You are scheduled for a Peripheral Angiogram on Wednesday, April 17 with Dr. [name].  1. Please arrive at the [facility] (Main Entrance A) at [facility]: [address] [place], [place] [address] at 6:30 AM (This time is two hours before your procedure to ensure your preparation).  Free valet parking service is available.   Special note: Every effort is made to have your procedure done on time. Please understand that emergencies sometimes delay scheduled procedures.  2. Diet: Do not eat solid foods after midnight.  The patient may have clear liquids until 5am upon the day of the procedure.  3. Labs: You will need to have blood drawn by 4/10. You do not need to be fasting. You do not need an appointment for the lab. Once in our office lobby there is a podium where you can sign in and ring the doorbell to alert [place] that you are here. The lab is open from 8:00 am to 4 pm; closed for lunch from 12:45pm-1:45pm.  You may also go to any of these [facility] locations:   [place] [facility] [place] [address]   4. Medication instructions in preparation for your procedure: Hold the Spironolactone the morning of the procedure Hold the Glyburide-metformin the morning of the procedure and then 48 hours after  On the morning of your procedure, take your Aspirin 81 mg and Plavix/Clopidogrel and any morning medicines NOT listed above.  You may use sips of water.  5. Plan for one night stay--bring personal belongings. 6. Bring a current list of your medications and current insurance cards. 7. You MUST have a responsible person to drive you home. 8. Someone MUST be with you the first 24 hours after you arrive home or your discharge will be delayed. 9. Please wear clothes that are easy to get on  and off and wear slip-on shoes.  Thank you for allowing [place] to care for you!   -- [facility]

## 2023-03-24 LAB — BASIC METABOLIC PANEL
BUN/Creatinine Ratio: 17 (ref 10–24)
BUN: 20 mg/dL (ref 8–27)
CO2: 26 mmol/L (ref 20–29)
Calcium: 9.4 mg/dL (ref 8.6–10.2)
Chloride: 102 mmol/L (ref 96–106)
Creatinine, Ser: 1.19 mg/dL (ref 0.76–1.27)
Glucose: 72 mg/dL (ref 70–99)
Potassium: 3.5 mmol/L (ref 3.5–5.2)
Sodium: 145 mmol/L — ABNORMAL HIGH (ref 134–144)
eGFR: 68 mL/min/{1.73_m2} (ref >59)

## 2023-03-24 LAB — CBC
Hematocrit: 35.8 % — ABNORMAL LOW (ref 37.5–51.0)
Hemoglobin: 12.9 g/dL — ABNORMAL LOW (ref 13.0–17.7)
MCH: 32.9 pg (ref 26.6–33.0)
MCHC: 36 g/dL — ABNORMAL HIGH (ref 31.5–35.7)
MCV: 91 fL (ref 79–97)
Platelets: 253 10*3/uL (ref 150–450)
RBC: 3.92 x10E6/uL — ABNORMAL LOW (ref 4.14–5.80)
RDW: 12.7 % (ref 11.6–15.4)
WBC: 9.2 10*3/uL (ref 3.4–10.8)

## 2023-03-25 ENCOUNTER — Other Ambulatory Visit: Payer: Self-pay | Admitting: Cardiovascular Disease

## 2023-03-26 NOTE — Telephone Encounter (Signed)
Refill request

## 2023-03-27 ENCOUNTER — Telehealth: Payer: Self-pay | Admitting: *Deleted

## 2023-03-27 NOTE — Telephone Encounter (Signed)
Abdominal aortogram scheduled at [facility] for: Wednesday March 28, 2023 8:30 AM Arrival time [facility] Main Entrance A at: 6:30 AM  Nothing to eat after midnight prior to procedure, clear liquids until 5 AM day of procedure.  Medication instructions: -Hold:  Glucovance-day of procedure and 48 hours post procedure  HCTZ-AM of procedure -Other usual morning medications can be taken with sips of water including aspirin 81 mg and Plavix 75 mg.  Confirmed patient has responsible adult to drive home post procedure and be with patient first 24 hours after arriving home.  Plan to go home the same day, you will only stay overnight if medically necessary.  Reviewed procedure instructions with patient.

## 2023-03-28 ENCOUNTER — Ambulatory Visit (HOSPITAL_COMMUNITY)
Admission: RE | Admit: 2023-03-28 | Discharge: 2023-03-28 | Disposition: A | Discharge status: Home / Self Care | Payer: BC Managed Care – PPO | Source: Ambulatory Visit | Attending: Cardiovascular Disease | Admitting: Cardiovascular Disease

## 2023-03-28 ENCOUNTER — Other Ambulatory Visit: Payer: Self-pay | Admitting: *Deleted

## 2023-03-28 ENCOUNTER — Ambulatory Visit (HOSPITAL_COMMUNITY)
Admission: RE | Disposition: A | Discharge status: Home / Self Care | Payer: Self-pay | Source: Ambulatory Visit | Attending: Cardiovascular Disease

## 2023-03-28 ENCOUNTER — Other Ambulatory Visit: Payer: Self-pay

## 2023-03-28 DIAGNOSIS — R7303 Prediabetes: Secondary | ICD-10-CM | POA: Diagnosis not present

## 2023-03-28 DIAGNOSIS — Z79899 Other long term (current) drug therapy: Secondary | ICD-10-CM | POA: Insufficient documentation

## 2023-03-28 DIAGNOSIS — I70212 Atherosclerosis of native arteries of extremities with intermittent claudication, left leg: Secondary | ICD-10-CM | POA: Diagnosis not present

## 2023-03-28 DIAGNOSIS — E785 Hyperlipidemia, unspecified: Secondary | ICD-10-CM | POA: Insufficient documentation

## 2023-03-28 DIAGNOSIS — F17290 Nicotine dependence, other tobacco product, uncomplicated: Secondary | ICD-10-CM | POA: Insufficient documentation

## 2023-03-28 DIAGNOSIS — I6521 Occlusion and stenosis of right carotid artery: Secondary | ICD-10-CM | POA: Diagnosis not present

## 2023-03-28 DIAGNOSIS — I739 Peripheral vascular disease, unspecified: Secondary | ICD-10-CM

## 2023-03-28 DIAGNOSIS — I10 Essential (primary) hypertension: Secondary | ICD-10-CM | POA: Insufficient documentation

## 2023-03-28 DIAGNOSIS — Z8551 Personal history of malignant neoplasm of bladder: Secondary | ICD-10-CM | POA: Diagnosis not present

## 2023-03-28 HISTORY — PX: ABDOMINAL AORTOGRAM W/LOWER EXTREMITY: CATH118223

## 2023-03-28 HISTORY — PX: PERIPHERAL VASCULAR INTERVENTION: CATH118257

## 2023-03-28 LAB — POCT ACTIVATED CLOTTING TIME: Activated Clotting Time: 255 seconds

## 2023-03-28 SURGERY — ABDOMINAL AORTOGRAM W/LOWER EXTREMITY
Anesthesia: LOCAL

## 2023-03-28 MED ORDER — SODIUM CHLORIDE 0.9% FLUSH: 3.0000 mL | INTRAVENOUS | Status: DC | PRN

## 2023-03-28 MED ORDER — ASPIRIN 81 MG PO CHEW: 81.0000 mg | CHEWABLE_TABLET | ORAL | Status: DC

## 2023-03-28 MED ORDER — ACETAMINOPHEN 325 MG PO TABS: 650.0000 mg | ORAL_TABLET | ORAL | Status: DC | PRN

## 2023-03-28 MED ORDER — SODIUM CHLORIDE 0.9% FLUSH: 3.0000 mL | Freq: Two times a day (BID) | INTRAVENOUS | Status: DC

## 2023-03-28 MED ORDER — MIDAZOLAM HCL 2 MG/2ML IJ SOLN
INTRAMUSCULAR | Status: AC
  Filled 2023-03-28: qty 2

## 2023-03-28 MED ORDER — IODIXANOL 320 MG/ML IV SOLN
INTRAVENOUS | Status: DC | PRN
  Administered 2023-03-28: 135 mL via INTRA_ARTERIAL

## 2023-03-28 MED ORDER — SODIUM CHLORIDE 0.9 % WEIGHT BASED INFUSION
3.0000 mL/kg/h | INTRAVENOUS | Status: AC
  Administered 2023-03-28: 3 mL/kg/h via INTRAVENOUS

## 2023-03-28 MED ORDER — SODIUM CHLORIDE 0.9 % IV SOLN: 250.0000 mL | INTRAVENOUS | Status: DC | PRN

## 2023-03-28 MED ORDER — HEPARIN SODIUM (PORCINE) 1000 UNIT/ML IJ SOLN
INTRAMUSCULAR | Status: AC
  Filled 2023-03-28: qty 10

## 2023-03-28 MED ORDER — FENTANYL CITRATE (PF) 100 MCG/2ML IJ SOLN
INTRAMUSCULAR | Status: DC | PRN
  Administered 2023-03-28: 50 ug via INTRAVENOUS
  Administered 2023-03-28: 25 ug via INTRAVENOUS

## 2023-03-28 MED ORDER — HYDRALAZINE HCL 20 MG/ML IJ SOLN: 5.0000 mg | INTRAMUSCULAR | Status: DC | PRN

## 2023-03-28 MED ORDER — HEPARIN (PORCINE) IN NACL 1000-0.9 UT/500ML-% IV SOLN
INTRAVENOUS | Status: DC | PRN
  Administered 2023-03-28 (×2): 500 mL

## 2023-03-28 MED ORDER — SODIUM CHLORIDE 0.9 % WEIGHT BASED INFUSION: 1.0000 mL/kg/h | INTRAVENOUS | Status: DC

## 2023-03-28 MED ORDER — HEPARIN SODIUM (PORCINE) 1000 UNIT/ML IJ SOLN
INTRAMUSCULAR | Status: DC | PRN
  Administered 2023-03-28: 8000 [IU] via INTRAVENOUS

## 2023-03-28 MED ORDER — ONDANSETRON HCL 4 MG/2ML IJ SOLN: 4.0000 mg | Freq: Four times a day (QID) | INTRAMUSCULAR | Status: DC | PRN

## 2023-03-28 MED ORDER — LIDOCAINE HCL (PF) 1 % IJ SOLN
INTRAMUSCULAR | Status: AC
  Filled 2023-03-28: qty 30

## 2023-03-28 MED ORDER — CLOPIDOGREL BISULFATE 300 MG PO TABS
ORAL_TABLET | ORAL | Status: AC
  Filled 2023-03-28: qty 1

## 2023-03-28 MED ORDER — MIDAZOLAM HCL 2 MG/2ML IJ SOLN
INTRAMUSCULAR | Status: DC | PRN
  Administered 2023-03-28 (×2): 1 mg via INTRAVENOUS

## 2023-03-28 MED ORDER — SODIUM CHLORIDE 0.9 % IV SOLN: INTRAVENOUS | Status: AC

## 2023-03-28 MED ORDER — LIDOCAINE HCL (PF) 1 % IJ SOLN
INTRAMUSCULAR | Status: DC | PRN
  Administered 2023-03-28: 15 mL

## 2023-03-28 MED ORDER — FENTANYL CITRATE (PF) 100 MCG/2ML IJ SOLN
INTRAMUSCULAR | Status: AC
  Filled 2023-03-28: qty 2

## 2023-03-28 SURGICAL SUPPLY — 26 items
BALLN STERLING OTW 4X60X135 (BALLOONS) ×2
BALLN STERLING OTW 5X150X150 (BALLOONS) ×2
BALLN STERLING OTW 6X60X135 (BALLOONS) ×2
BALLOON STERLING OTW 4X60X135 (BALLOONS) IMPLANT
BALLOON STERLING OTW 5X150X150 (BALLOONS) IMPLANT
BALLOON STERLING OTW 6X60X135 (BALLOONS) IMPLANT
CATH ANGIO 5F PIGTAIL 65CM (CATHETERS) IMPLANT
CATH CROSS OVER TEMPO 5F (CATHETERS) IMPLANT
CATH STRAIGHT 5FR 65CM (CATHETERS) IMPLANT
DEVICE CLOSURE MYNXGRIP 6/7F (Vascular Products) IMPLANT
KIT ENCORE 26 ADVANTAGE (KITS) IMPLANT
KIT MICROPUNCTURE NIT STIFF (SHEATH) IMPLANT
KIT PV (KITS) ×3 IMPLANT
SHEATH CATAPULT 6FR 45 (SHEATH) IMPLANT
SHEATH PINNACLE 5F 10CM (SHEATH) IMPLANT
SHEATH PINNACLE 6F 10CM (SHEATH) IMPLANT
SHEATH PROBE COVER 6X72 (BAG) IMPLANT
STENT ELUVIA 6X150X130 (Permanent Stent) IMPLANT
STOPCOCK MORSE 400PSI 3WAY (MISCELLANEOUS) IMPLANT
SYR MEDRAD MARK 7 150ML (SYRINGE) ×3 IMPLANT
TAPE SHOOT N SEE (TAPE) IMPLANT
TRANSDUCER W/STOPCOCK (MISCELLANEOUS) ×3 IMPLANT
TRAY PV CATH (CUSTOM PROCEDURE TRAY) ×3 IMPLANT
TUBING CIL FLEX 10 FLL-RA (TUBING) IMPLANT
WIRE HITORQ VERSACORE ST 145CM (WIRE) IMPLANT
WIRE SHEPHERD 6G .018 (WIRE) IMPLANT

## 2023-03-28 NOTE — H&P (Signed)
Cardiology Office Note     Date:  02/20/2023    ID:  Gabriel [name], DOB 1956/07/26, MRN [id number]   PCP:  [name], MD       Cardiologist:   [name], MD        Chief Complaint  Patient presents with   Follow-up      6 months.        History of Present Illness: Gabriel [name] is a 67 y.o. male who is here today for follow-up visit regarding peripheral arterial disease and uncontrolled hypertension.   He has no previous cardiac history.  He has known history of essential hypertension, borderline diabetes and previous bladder cancer.  He smokes cigars daily.  He reports family history of coronary artery disease. He was seen by [facility] cardiology in 2022 for atypical chest pain.  He underwent a treadmill nuclear stress test which showed no evidence of ischemia. He was seen in February of 2023 for severe right calf claudication and numbness in both feet.   He underwent noninvasive vascular studies which showed an ABI of 0.63 on the right and 0.88 on the left.  Duplex showed severe stenosis in the right distal SFA and moderate left SFA disease.     Angiography in February 2023 showed no significant renal artery stenosis, no significant aortoiliac disease and severe subtotal occlusion of the right SFA with significant below the knee disease.  I performed directional arthrectomy and drug-coated balloon angioplasty to the right SFA.   He is known to have refractory hypertension with no evidence of renal artery stenosis.   He reported worsening left calf claudication.  He had repeat Doppler studies done last week which showed an ABI of 0.80 on the right and 0.84 on the left.  Duplex showed patent right SFA with normal velocities.  There was moderate stenosis in the right TP trunk.   He reported left calf claudication during last visit on I asked him to increase his walking exercise program.  However, he reports worsening symptoms and he is only able to walk a short distance before  having left calf claudication.  No symptoms on the right side.  This has significantly affected his ability to perform his job and work duties.  No chest pain or shortness of breath.   He had an emergency room visit recently for abdominal pain and was found to have a small kidney stone.         Past Medical History:  Diagnosis Date   Bladder cancer (HCC)     Diabetes mellitus without complication (HCC) 12/12/2011    borderline- not taking prescribed med   Headache(784.0) 08/11/1989    not current problem   Hyperlipidemia     Hypertension     PAD (peripheral artery disease) (HCC) 2023    right arthrectomy, balloon angioplasty rt SFA           Past Surgical History:  Procedure Laterality Date   ABDOMINAL AORTOGRAM W/LOWER EXTREMITY N/A 01/25/2022    Procedure: ABDOMINAL AORTOGRAM W/LOWER EXTREMITY;  Surgeon: [name], MD;  [facility];  Service: Cardiovascular;  Laterality: N/A;   CYSTOSCOPY W/ URETERAL STENT PLACEMENT Bilateral 09/12/2013    Procedure: CYSTOSCOPY WITH BILATERAL  RETROGRADE PYELOGRAM  ;  Surgeon: [name], MD;  Location: [facility];  Service: Urology;  Laterality: Bilateral;   KNEE ARTHROSCOPY Right many years ago-    doesn't remember date   NASAL POLYP SURGERY  date unknown   PERIPHERAL VASCULAR ATHERECTOMY Right 01/25/2022    Procedure: PERIPHERAL VASCULAR ATHERECTOMY;  Surgeon: [name], MD;  Location: [facility];  Service: Cardiovascular;  Laterality: Right;   RECTAL SURGERY   date unknown   SHOULDER ARTHROSCOPY WITH SUBACROMIAL DECOMPRESSION Right 07/28/2022    Procedure: SHOULDER ARTHROSCOPY WITH SUBACROMIAL DECOMPRESSION DISTAL CLAVICLE RESECTION;  Surgeon: [name], MD;  Location: [facility];  Service: Orthopedics;  Laterality: Right;   TRANSURETHRAL RESECTION OF BLADDER TUMOR WITH GYRUS (TURBT-GYRUS) N/A 09/12/2013    Procedure: TRANSURETHRAL RESECTION OF BLADDER TUMOR WITH  GYRUS (TURBT-GYRUS);  Surgeon: [name], MD;  Location: [facility];  Service: Urology;  Laterality: N/A;  1 HR                Current Outpatient Medications  Medication Sig Dispense Refill   acetaminophen (TYLENOL) 500 MG tablet Take 2 tablets (1,000 mg total) by mouth every 6 (six) hours as needed. 100 tablet 2   aspirin 81 MG EC tablet Take 81 mg by mouth daily.       atorvastatin (LIPITOR) 20 MG tablet TAKE 1 TABLET(20 MG) BY MOUTH DAILY 90 tablet 1   clopidogrel (PLAVIX) 75 MG tablet TAKE 1 TABLET(75 MG) BY MOUTH DAILY 30 tablet 3   dicyclomine (BENTYL) 20 MG tablet Take 1 tablet (20 mg total) by mouth 2 (two) times daily. 20 tablet 0   glyBURIDE-metformin (GLUCOVANCE) 2.5-500 MG tablet Take 1 tablet by mouth 2 (two) times daily.       Olmesartan-amLODIPine-HCTZ 40-10-25 MG TABS Take 1 tablet by mouth daily. 90 tablet 2   ondansetron (ZOFRAN-ODT) 4 MG disintegrating tablet Take 1 tablet (4 mg total) by mouth every 8 (eight) hours as needed for nausea or vomiting. 20 tablet 0   spironolactone (ALDACTONE) 25 MG tablet Take 1 tablet (25 mg total) by mouth daily. 90 tablet 1    No current facility-administered medications for this visit.      Allergies:   Patient has no known allergies.      Social History:  The patient  reports that he has been smoking cigars. He does not have any smokeless tobacco history on file. He reports that he does not drink alcohol and does not use drugs.    Family History:  [name] family history is remarkable for coronary artery disease and hypertension.     ROS:  Please see the history of present illness.   Otherwise, review of systems are positive for none.   All other systems are reviewed and negative.      PHYSICAL EXAM: VS:  BP 124/62 (BP Location: Left Arm, Patient Position: Sitting, Cuff Size: Normal)   Pulse 77   Ht 6\' 2"  (1.88 m)   Wt 166 lb (75.3 kg)   BMI 21.31 kg/m  , BMI Body mass index is 21.31 kg/m. GEN:  Well nourished, well developed, in no acute distress  HEENT: normal  Neck: no JVD,  or masses.  Right carotid bruit. Cardiac: RRR; no murmurs, rubs, or gallops,no edema  Respiratory:  clear to auscultation bilaterally, normal work of breathing GI: soft, nontender, nondistended, + BS MS: no deformity or atrophy  Skin: warm and dry, no rash Neuro:  Strength and sensation are intact Psych: euthymic mood, full affect Vascular: Femoral pulses are normal bilaterally.      EKG:  EKG is ordered today. EKG sinus rhythm with inferior lateral T wave changes suggestive of ischemia.  Recent Labs: 02/11/2023: ALT 18; BUN 22; Creatinine, Ser 1.19; Hemoglobin 12.3; Platelets 201; Potassium 3.2; Sodium 140      Lipid Panel Labs (Brief)          Component Value Date/Time    CHOL 118 04/18/2022 0840    TRIG 394 (H) 04/18/2022 0840    HDL 28 (L) 04/18/2022 0840    CHOLHDL 4.2 04/18/2022 0840    LDLCALC 33 04/18/2022 0840             Wt Readings from Last 3 Encounters:  02/20/23 166 lb (75.3 kg)  02/11/23 175 lb (79.4 kg)  08/22/22 173 lb (78.5 kg)                No data to display                 ASSESSMENT AND PLAN:   1.  Peripheral arterial disease: Status post right SFA directional atherectomy and drug-coated balloon angioplasty.  The patient does have residual below the knee disease on the right but has no symptoms there.   He now returns with worsening severe left calf claudication (Rutherford class III) and spite of a walking program.  This is significantly affecting his ability to perform his work.  I discussed with him different management options and given that he failed medical therapy and a walking program, I recommend proceeding with angiography and possible endovascular intervention. I discussed the procedure in details as well as risk and benefits.   2.  Moderate right carotid stenosis: He has stable 40 to 59% stenosis in the right carotid artery most recently  checked in February.  Repeat study in 1 year.  Continue treatment of risk factors.   3.  Hyperlipidemia: Continue treatment with atorvastatin.  Repeat lipid profile showed improvement in LDL which was down to 33.   4.  Refractory hypertension: Blood pressure is well controlled on current medications.   5.  Cigar use: I discussed with him the importance of cessation.       Disposition: Proceed with angiography in April and follow-up after.   Signed,   [name], MD  02/20/2023 8:39 AM    [facility]  Addendum on 03/28/2023 The patient was seen and examined.  He reports persistent severe left calf claudication no symptoms on the right side. By physical exam, heart is regular with no murmurs.  Lungs are clear to auscultation with no significant leg edema. He has palpable distal pulses on the right and very diminished on the left.  I again discussed the procedure with him in details and he has no further questions.  MKirke Corin 03/28/2023

## 2023-03-28 NOTE — Progress Notes (Signed)
Pt ambulated to and from bathroom to void with no signs of oozing from right groin site  

## 2023-03-29 ENCOUNTER — Encounter (HOSPITAL_COMMUNITY): Payer: Self-pay | Admitting: Cardiovascular Disease

## 2023-03-29 MED FILL — Clopidogrel Bisulfate Tab 300 MG (Base Equiv): ORAL | Qty: 1 | Status: AC

## 2023-04-04 ENCOUNTER — Ambulatory Visit: Payer: BC Managed Care – PPO | Admitting: Physician Assistant

## 2023-04-19 ENCOUNTER — Ambulatory Visit (HOSPITAL_COMMUNITY)
Admission: RE | Admit: 2023-04-19 | Discharge: 2023-04-19 | Disposition: A | Discharge status: Home / Self Care | Payer: BC Managed Care – PPO | Source: Ambulatory Visit | Attending: Cardiology | Admitting: Cardiology

## 2023-04-19 DIAGNOSIS — I739 Peripheral vascular disease, unspecified: Secondary | ICD-10-CM

## 2023-04-19 LAB — VAS US ABI WITH/WO TBI
Left ABI: 0.91
Right ABI: 0.82

## 2023-04-19 NOTE — Addendum Note (Signed)
Addended by: Sandi Mariscal on: 04/19/2023 04:11 PM   Modules accepted: Orders

## 2023-04-23 ENCOUNTER — Telehealth: Payer: Self-pay | Admitting: Cardiovascular Disease

## 2023-04-23 NOTE — Telephone Encounter (Signed)
Forms received from [employer] on 04/23/23. Left voice mail to inform patient of signature needed and $29 processing fee before completing request. Placed forms in folder at front desk.

## 2023-04-24 NOTE — Telephone Encounter (Signed)
Attempted to contact patient regarding processing fee. Unable to leave voice mail.

## 2023-04-24 NOTE — Telephone Encounter (Signed)
Patient is requesting call back to discuss processing fee of $29. Please advise.

## 2023-04-27 ENCOUNTER — Encounter: Payer: Self-pay | Admitting: Physician Assistant

## 2023-04-27 ENCOUNTER — Ambulatory Visit: Payer: BC Managed Care – PPO | Attending: Physician Assistant | Admitting: Physician Assistant

## 2023-04-27 VITALS — BP 138/78 | HR 83 | Ht 74.0 in | Wt 175.2 lb

## 2023-04-27 DIAGNOSIS — I739 Peripheral vascular disease, unspecified: Secondary | ICD-10-CM | POA: Diagnosis not present

## 2023-04-27 DIAGNOSIS — R200 Anesthesia of skin: Secondary | ICD-10-CM

## 2023-04-27 DIAGNOSIS — I10 Essential (primary) hypertension: Secondary | ICD-10-CM | POA: Diagnosis not present

## 2023-04-27 NOTE — Progress Notes (Unsigned)
Cardiology Office Note:    Date:  04/29/2023   ID:  GEDEON [name], DOB [birth year], MRN [id number]  PCP:  [name], MD   [facility] Providers Cardiologist:  [name], MD     Referring MD: [name], MD   Chief Complaint  Patient presents with   Follow-up    Seen for Arida    History of Present Illness:    Gabriel [name] is a 67 y.o. male with a hx of hypertension, hyperlipidemia, prediabetes, bladder cancer and PAD.  He has no prior cardiac history.  He smokes cigars.  He has strong family history of CAD although he himself has never been diagnosed with CAD.  He was seen by [facility] cardiology service in 2022 for atypical chest pain.  A treadmill nuclear stress test showed no evidence of ischemia.  He was seen in February 2023 for severe right calf claudication and numbness in both feet.  He underwent noninvasive vascular study which showed ABI 0.63 on the right and a 0.88 on the left.  Duplex shows severe stenosis of the right distal SFA and moderate left SFA disease.  He underwent lower extremity angiography in February 2023 which showed no significant renal artery stenosis, no significant aortoiliac disease, severe subtotal occlusion of right SFA was significant below-knee disease.  This was treated with directional atherectomy and drug-coated balloon angioplasty of right SFA.  Due to recurrent left lower extremity symptom, a repeat Doppler was obtained which showed right ABI 0.8, left ABI 0.84, patent right SFA was normal velocities, moderate disease in the right TP trunk.  More recently, patient underwent lower extremity angiography on 03/28/2023 which showed no significant aortoiliac disease, significant stenosis in left mid SFA, moderate left popliteal artery stenosis with two-vessel runoff below the knee with occluded posterior tibial artery.  The left anterior tibial and peroneal artery had borderline significant disease proximally.  Patient underwent successful  angioplasty and drug-eluting stent placement to the mid to distal left SFA.  Postprocedure, it was recommended he continue dual antiplatelet therapy for 6 months.  He underwent lower extremity ABI and LEA on 04/19/2023, right ABI 0.81, left ABI has improved to 0.91 from the previous of 0.84.  Patient had a 30 to 49% stenosis in the left common femoral artery, 30 to 49% stenosis in SFA and popliteal artery, patent mid to distal SFA stent.  Patient presents today for follow-up.  He does have right calf pain, however I think this is musculoskeletal in nature rather than a blood flow issue.  He feels good after his recent left lower extremity intervention.  He complaining of bilateral foot numbness which I think is due to diabetic neuropathy.  He also has numbness in the fourth and fifth digit and left forearm.  He has bilateral arm pressure does not indicate subclavian artery stenosis.  I suspect he is arm numbness and the tingling sensation is due to either compressed nerve near the neck area or ulnar nerve entrapment.  I recommend he discuss this with his primary care provider who can do another risk assessment and refer him to orthopedic doctor.   Past Medical History:  Diagnosis Date   Bladder cancer (HCC)    Diabetes mellitus without complication (HCC) 12/12/2011   borderline- not taking prescribed med   Headache(784.0) 08/11/1989   not current problem   Hyperlipidemia    Hypertension    PAD (peripheral artery disease) (HCC) 2023   right arthrectomy, balloon angioplasty rt SFA  Past Surgical History:  Procedure Laterality Date   ABDOMINAL AORTOGRAM W/LOWER EXTREMITY N/A 01/25/2022   Procedure: ABDOMINAL AORTOGRAM W/LOWER EXTREMITY;  Surgeon: [name], MD;  Location: [facility];  Service: Cardiovascular;  Laterality: N/A;   ABDOMINAL AORTOGRAM W/LOWER EXTREMITY N/A 03/28/2023   Procedure: ABDOMINAL AORTOGRAM W/LOWER EXTREMITY;  Surgeon: [name], MD;  Location: [facility];  Service: Cardiovascular;  Laterality: N/A;   CYSTOSCOPY W/ URETERAL STENT PLACEMENT Bilateral 09/12/2013   Procedure: CYSTOSCOPY WITH BILATERAL  RETROGRADE PYELOGRAM  ;  Surgeon: [name], MD;  Location[facility];  Service: Urology;  Laterality: Bilateral;   KNEE ARTHROSCOPY Right many years ago-   doesn't remember date   NASAL POLYP SURGERY  date unknown   PERIPHERAL VASCULAR ATHERECTOMY Right 01/25/2022   Procedure: PERIPHERAL VASCULAR ATHERECTOMY;  Surgeon: [name], MD;  [facility]: [facility];  Service: Cardiovascular;  Laterality: Right;   PERIPHERAL VASCULAR INTERVENTION  03/28/2023   Procedure: PERIPHERAL VASCULAR INTERVENTION;  Surgeon: [name], MD;  [facility]: [facility];  Service: Cardiovascular;;   RECTAL SURGERY  date unknown   SHOULDER ARTHROSCOPY WITH SUBACROMIAL DECOMPRESSION Right 07/28/2022   Procedure: SHOULDER ARTHROSCOPY WITH SUBACROMIAL DECOMPRESSION DISTAL CLAVICLE RESECTION;  Surgeon: [name], MD;  Location: [facility];  Service: Orthopedics;  Laterality: Right;   TRANSURETHRAL RESECTION OF BLADDER TUMOR WITH GYRUS (TURBT-GYRUS) N/A 09/12/2013   Procedure: TRANSURETHRAL RESECTION OF BLADDER TUMOR WITH GYRUS (TURBT-GYRUS);  Surgeon: [name], MD;  Location: [facility];  Service: Urology;  Laterality: N/A;  1 HR     Current Medications: Current Meds  Medication Sig   amLODipine-olmesartan (AZOR) 10-40 MG tablet Take 1 tablet by mouth daily.   aspirin 81 MG EC tablet Take 81 mg by mouth daily.   atorvastatin (LIPITOR) 20 MG tablet TAKE 1 TABLET(20 MG) BY MOUTH DAILY   carvedilol (COREG) 6.25 MG tablet Take 6.25 mg by mouth once.   ciclopirox (PENLAC) 8 % solution Apply 8 % topically at bedtime and may repeat dose one time if needed.   clopidogrel (PLAVIX) 75 MG tablet TAKE 1 TABLET(75 MG) BY MOUTH DAILY   glyBURIDE-metformin (GLUCOVANCE) 2.5-500 MG tablet  Take 1 tablet by mouth daily as needed (High Blood Sugar).   hydrochlorothiazide (HYDRODIURIL) 25 MG tablet Take 25 mg by mouth daily.   pioglitazone (ACTOS) 30 MG tablet Take 30 mg by mouth daily.     Allergies:   Patient has no known allergies.   Social History   Socioeconomic History   Marital status: Married    Spouse name: Not on file   Number of children: Not on file   Years of education: Not on file   Highest education level: Not on file  Occupational History   Not on file  Tobacco Use   Smoking status: Every Day    Types: Cigars   Smokeless tobacco: Not on file   Tobacco comments:    smokes  Substance and Sexual Activity   Alcohol use: No   Drug use: No   Sexual activity: Not on file  Other Topics Concern   Not on file  Social History Narrative   Not on file   Social Determinants of Health   Financial Resource Strain: Not on file  Food Insecurity: Not on file  Transportation Needs: Not on file  Physical Activity: Not on file  Stress: Not on file  Social Connections: Not on file  Family History: [name] family history is not on file.  ROS:   Please see the history of present illness.     All other systems reviewed and are negative.  EKGs/Labs/Other Studies Reviewed:    The following studies were reviewed today:  N/A  EKG:  EKG is not ordered today.    Recent Labs: 02/11/2023: ALT 18 03/23/2023: BUN 20; Creatinine, Ser 1.19; Hemoglobin 12.9; Platelets 253; Potassium 3.5; Sodium 145  Recent Lipid Panel    Component Value Date/Time   CHOL 118 04/18/2022 0840   TRIG 394 (H) 04/18/2022 0840   HDL 28 (L) 04/18/2022 0840   CHOLHDL 4.2 04/18/2022 0840   LDLCALC 33 04/18/2022 0840     Risk Assessment/Calculations:      STOP-Bang Score:          Physical Exam:    VS:  BP 138/78 (BP Location: Right Arm, Patient Position: Sitting, Cuff Size: Normal)   Pulse 83   Ht 6\' 2"  (1.88 m)   Wt 175 lb 3.2 oz (79.5 kg)   SpO2 98%   BMI 22.49  kg/m        Wt Readings from Last 3 Encounters:  04/27/23 175 lb 3.2 oz (79.5 kg)  03/28/23 165 lb (74.8 kg)  02/20/23 166 lb (75.3 kg)     GEN:  Well nourished, well developed in no acute distress HEENT: Normal NECK: No JVD; No carotid bruits LYMPHATICS: No lymphadenopathy CARDIAC: RRR, no murmurs, rubs, gallops RESPIRATORY:  Clear to auscultation without rales, wheezing or rhonchi  ABDOMEN: Soft, non-tender, non-distended MUSCULOSKELETAL:  No edema; No deformity  SKIN: Warm and dry NEUROLOGIC:  Alert and oriented x 3 PSYCHIATRIC:  Normal affect   ASSESSMENT:    1. PAD (peripheral artery disease) (HCC)   2. Essential hypertension   3. Left arm numbness    PLAN:    In order of problems listed above:  PAD: Patient underwent lower extremity angiography in April 2024 and had angioplasty and drug-eluting stent placement to mid to distal left SFA.  Repeat lower extremity Doppler showed patent stent.  Will repeat ABI and LEA in 6 months prior to follow-up with Dr. Kirke Corin  Hypertension: Blood pressure stable  Left arm numbness: Involve the forearm and left fourth and fifth digit.  Suspect either cervical neuropathy or ulnar nerve impingement.          Medication Adjustments/Labs and Tests Ordered: Current medicines are reviewed at length with the patient today.  Concerns regarding medicines are outlined above.  No orders of the defined types were placed in this encounter.  No orders of the defined types were placed in this encounter.   Patient Instructions  Medication Instructions:   Your physician recommends that you continue on your current medications as directed. Please refer to the Current Medication list given to you today.  *If you need a refill on your cardiac medications before your next appointment, please call your pharmacy*  Lab Work: NONE ordered at this time of appointment   If you have labs (blood work) drawn today and your tests are completely  normal, you will receive your results only by: MyChart Message (if you have MyChart) OR A paper copy in the mail If you have any lab test that is abnormal or we need to change your treatment, we will call you to review the results.  Testing/Procedures: NONE ordered at this time of appointment   Follow-Up: At [facility], you and your health needs are our priority.  As part of our continuing mission to provide you with exceptional heart care, we have created designated Provider Care Teams.  These Care Teams include your primary Cardiologist (physician) and Advanced Practice Providers (APPs -  Physician Assistants and Nurse Practitioners) who all work together to provide you with the care you need, when you need it.   Your next appointment:   6 month(s) after ABI's and LEA's   Provider:   [name], MD     Other Instructions     Signed, Azalee Course, PA  04/29/2023 3:31 PM    [facility]

## 2023-04-27 NOTE — Patient Instructions (Signed)
Medication Instructions:   Your physician recommends that you continue on your current medications as directed. Please refer to the Current Medication list given to you today.  *If you need a refill on your cardiac medications before your next appointment, please call your pharmacy*  Lab Work: NONE ordered at this time of appointment   If you have labs (blood work) drawn today and your tests are completely normal, you will receive your results only by: MyChart Message (if you have MyChart) OR A paper copy in the mail If you have any lab test that is abnormal or we need to change your treatment, we will call you to review the results.  Testing/Procedures: NONE ordered at this time of appointment   Follow-Up: At [facility], you and your health needs are our priority.  As part of our continuing mission to provide you with exceptional heart care, we have created designated Provider Care Teams.  These Care Teams include your primary Cardiologist (physician) and Advanced Practice Providers (APPs -  Physician Assistants and Nurse Practitioners) who all work together to provide you with the care you need, when you need it.   Your next appointment:   6 month(s) after ABI's and LEA's   Provider:   [name], MD     Other Instructions

## 2023-04-29 ENCOUNTER — Encounter: Payer: Self-pay | Admitting: Physician Assistant

## 2023-05-08 NOTE — Telephone Encounter (Signed)
Spoke with patient about Disability forms at front desk. Patient verbalized understanding and said that he will come by sometime next week to complete them.

## 2023-05-11 ENCOUNTER — Telehealth: Payer: Self-pay | Admitting: Cardiovascular Disease

## 2023-05-11 DIAGNOSIS — I10 Essential (primary) hypertension: Secondary | ICD-10-CM

## 2023-05-11 NOTE — Telephone Encounter (Signed)
Pt is requesting a refill on carvedilol 6.25 mg tablet. Dr. Kirke Corin did not prescribe this medication. Would Dr. Kirke Corin like to refill this medication? Please address

## 2023-05-11 NOTE — Telephone Encounter (Signed)
*  STAT* If patient is at the pharmacy, call can be transferred to refill team.   1. Which medications need to be refilled? (please list name of each medication and dose if known) carvedilol (COREG) 6.25 MG tablet   2. Which pharmacy/location (including street and city if local pharmacy) is medication to be sent to? [id number] - [place], [place] - [address]   3. Do they need a 30 day or 90 day supply? 30

## 2023-05-14 NOTE — Telephone Encounter (Signed)
Left a message for the patient to call back to verify if and how he is taking the Carvedilol. Prescription was put in at his last office visit with Azalee Course and listed as Carvedilol 6.25 mg once.

## 2023-05-14 NOTE — Telephone Encounter (Signed)
Ok to fill 

## 2023-05-15 NOTE — Telephone Encounter (Signed)
  Pt is returning call,he said, he is taking carvedilol 6.25 mg 1 tablet a day. He requested if he can get 90 days supply

## 2023-05-15 NOTE — Telephone Encounter (Signed)
Left a message for the patient to call back to see who prescribed it and Carvedilol is a twice daily medicine.

## 2023-05-15 NOTE — Telephone Encounter (Signed)
Patient is currently taking Carvedilol 6.25 by mouth once a day

## 2023-05-22 MED ORDER — CARVEDILOL 6.25 MG PO TABS
6.2500 mg | ORAL_TABLET | Freq: Two times a day (BID) | ORAL | 3 refills | Status: DC
Start: 2023-05-22 — End: 2024-07-29

## 2023-05-22 NOTE — Telephone Encounter (Signed)
Patient aware prescription sent to pharmacy on file

## 2023-06-06 NOTE — Telephone Encounter (Signed)
Called and spoke with the patient about Disability forms. Pt said that he no longer needs them to be completed.

## 2023-09-17 ENCOUNTER — Ambulatory Visit: Payer: 59

## 2023-09-17 ENCOUNTER — Encounter: Payer: Self-pay | Admitting: Podiatry

## 2023-09-17 ENCOUNTER — Ambulatory Visit (INDEPENDENT_AMBULATORY_CARE_PROVIDER_SITE_OTHER): Payer: 59 | Admitting: Podiatry

## 2023-09-17 DIAGNOSIS — R200 Anesthesia of skin: Secondary | ICD-10-CM

## 2023-09-17 DIAGNOSIS — M7752 Other enthesopathy of left foot: Secondary | ICD-10-CM | POA: Diagnosis not present

## 2023-09-17 DIAGNOSIS — M21619 Bunion of unspecified foot: Secondary | ICD-10-CM

## 2023-09-17 DIAGNOSIS — M21612 Bunion of left foot: Secondary | ICD-10-CM

## 2023-09-17 DIAGNOSIS — B351 Tinea unguium: Secondary | ICD-10-CM

## 2023-09-17 DIAGNOSIS — M79675 Pain in left toe(s): Secondary | ICD-10-CM

## 2023-09-17 DIAGNOSIS — L84 Corns and callosities: Secondary | ICD-10-CM

## 2023-09-17 DIAGNOSIS — M79674 Pain in right toe(s): Secondary | ICD-10-CM | POA: Diagnosis not present

## 2023-09-17 DIAGNOSIS — I999 Unspecified disorder of circulatory system: Secondary | ICD-10-CM

## 2023-09-17 MED ORDER — TRIAMCINOLONE ACETONIDE 10 MG/ML IJ SUSP
10.0000 mg | Freq: Once | INTRAMUSCULAR | Status: AC
Start: 2023-09-17 — End: 2023-09-17
  Administered 2023-09-17: 10 mg via INTRA_ARTICULAR

## 2023-09-17 NOTE — Progress Notes (Signed)
Subjective:   Patient ID: Gabriel [name], male   DOB: 67 y.o.   MRN: [id number]   HPI Patient presents with a lot of pain in the bottom of the left over the [name] foot history of vascular disease and structural bunion deformity of both feet with fluid buildup under the first MPJ left over [name]   ROS      Objective:  Physical Exam  Reduction blood flow bilateral is improved with previous stenting but still moderate symptoms with inflammation fluid of the subfirst metatarsal head left painful when pressed     Assessment:  Inflammatory capsulitis first MPJ left with lesion formation painful and nailbeds 1-5 both feet thickened painful.  With vascular disease bilateral that is contributing factor to all problems     Plan:  H&P reviewed sterile prep injected the plantar capsule left 3 mg dexamethasone Kenalog 5 mg Xylocaine and debrided lesions bilateral no angiogenic bleeding debrided thick painful nailbeds 1-5 bilateral no angiogenic bleeding discussed his vascular disease and ultimately may require other procedures and if bunions continue to be a problem we would get permission prior to surgical intervention  X-rays indicate structural bunion deformity bilateral with elevation of the intermetatarsal angle at least 15 degrees both feet

## 2023-10-11 LAB — HEMOGLOBIN A1C: Hemoglobin A1C: 6.9

## 2023-10-11 NOTE — Progress Notes (Signed)
Pt has PCP. Pt declined SDOH. This nurse did education surrounding diabetes, lifestyle management and risks of high blood pressure. Pt recommended to go see PCP regarding A1C result.

## 2023-10-23 ENCOUNTER — Inpatient Hospital Stay (HOSPITAL_COMMUNITY): Admission: RE | Admit: 2023-10-23 | Payer: BC Managed Care – PPO | Source: Ambulatory Visit

## 2023-10-30 ENCOUNTER — Encounter: Payer: Self-pay | Admitting: Cardiovascular Disease

## 2023-10-30 ENCOUNTER — Other Ambulatory Visit (HOSPITAL_COMMUNITY): Payer: Self-pay

## 2023-10-30 ENCOUNTER — Other Ambulatory Visit: Payer: Self-pay

## 2023-10-30 ENCOUNTER — Ambulatory Visit
Payer: No Typology Code available for payment source | Attending: Cardiovascular Disease | Admitting: Cardiovascular Disease

## 2023-10-30 VITALS — BP 138/70 | HR 75 | Ht 74.0 in | Wt 157.6 lb

## 2023-10-30 DIAGNOSIS — E785 Hyperlipidemia, unspecified: Secondary | ICD-10-CM

## 2023-10-30 DIAGNOSIS — I10 Essential (primary) hypertension: Secondary | ICD-10-CM

## 2023-10-30 DIAGNOSIS — I6521 Occlusion and stenosis of right carotid artery: Secondary | ICD-10-CM

## 2023-10-30 DIAGNOSIS — I739 Peripheral vascular disease, unspecified: Secondary | ICD-10-CM | POA: Diagnosis not present

## 2023-10-30 MED ORDER — ATORVASTATIN CALCIUM 20 MG PO TABS
20.0000 mg | ORAL_TABLET | Freq: Every day | ORAL | 3 refills | Status: DC
  Filled 2023-10-30: qty 90, 90d supply, fill #0

## 2023-10-30 MED ORDER — CLOPIDOGREL BISULFATE 75 MG PO TABS
75.0000 mg | ORAL_TABLET | Freq: Every day | ORAL | 3 refills | Status: DC
  Filled 2023-10-30: qty 90, 90d supply, fill #0

## 2023-10-30 NOTE — Patient Instructions (Signed)
Medication Instructions:  No changes *If you need a refill on your cardiac medications before your next appointment, please call your pharmacy*   Lab Work: None ordered If you have labs (blood work) drawn today and your tests are completely normal, you will receive your results only by: MyChart Message (if you have MyChart) OR A paper copy in the mail If you have any lab test that is abnormal or we need to change your treatment, we will call you to review the results.   Testing/Procedures: Your physician has requested that you have a carotid duplex in February 2025. This test is an ultrasound of the carotid arteries in your neck. It looks at blood flow through these arteries that supply the brain with blood. Allow one hour for this exam. There are no restrictions or special instructions. This will take place at [address] [facility] [facility], [address].  Please note: We ask at that you not bring children with you during ultrasound (echo/ vascular) testing. Due to room size and safety concerns, children are not allowed in the ultrasound rooms during exams. Our front office staff cannot provide observation of children in our lobby area while testing is being conducted. An adult accompanying a patient to their appointment will only be allowed in the ultrasound room at the discretion of the ultrasound technician under special circumstances. We apologize for any inconvenience.    Follow-Up: At [facility], you and your health needs are our priority.  As part of our continuing mission to provide you with exceptional heart care, we have created designated Provider Care Teams.  These Care Teams include your primary Cardiologist (physician) and Advanced Practice Providers (APPs -  Physician Assistants and Nurse Practitioners) who all work together to provide you with the care you need, when you need it.  We recommend signing up for the patient portal called "MyChart".  Sign up information is provided on  this After Visit Summary.  MyChart is used to connect with patients for Virtual Visits (Telemedicine).  Patients are able to view lab/test results, encounter notes, upcoming appointments, etc.  Non-urgent messages can be sent to your provider as well.   To learn more about what you can do with MyChart, go to [email or web address].    Your next appointment:   6 month(s)  Provider:   [name], MD

## 2023-10-30 NOTE — Progress Notes (Signed)
Cardiology Office Note   Date:  10/30/2023   ID:  [name], DOB May 13, 1956, MRN [id number]  PCP:  Gabriel Harries, MD  Cardiologist:   Gabriel Bears, MD   No chief complaint on file.     History of Present Illness: Gabriel [name] is a 67 y.o. male who is here today for follow-up visit regarding peripheral arterial disease and uncontrolled hypertension.   He has no previous cardiac history.  He has known history of essential hypertension, borderline diabetes and previous bladder cancer.  He smokes cigars daily.  He reports family history of coronary artery disease. He was seen by [facility] cardiology in 2022 for atypical chest pain.  He underwent a treadmill nuclear stress test which showed no evidence of ischemia. He was seen in February of 2023 for severe right calf claudication and numbness in both feet.  He underwent noninvasive vascular studies which showed an ABI of 0.63 on the right and 0.88 on the left.  Duplex showed severe stenosis in the right distal SFA and moderate left SFA disease.    Angiography in February 2023 showed no significant renal artery stenosis, no significant aortoiliac disease and severe subtotal occlusion of the right SFA with significant below the knee disease.  I performed directional arthrectomy and drug-coated balloon angioplasty to the right SFA.   He is known to have refractory hypertension with no evidence of renal artery stenosis.   He reported worsening left calf claudication.  He had repeat Doppler studies done last week which showed an ABI of 0.80 on the right and 0.84 on the left.  Duplex showed patent right SFA with normal velocities.  There was moderate stenosis in the right TP trunk.  He had worsening left calf claudication earlier this year.  Angiography was performed in April which showed significant stenosis in the left mid SFA with moderate popliteal artery stenosis and two-vessel runoff below the knee.  I performed angioplasty and  drug-eluting stent placement to the mid/distal SFA. Postprocedure Doppler studies showed mildly reduced ABI with patent left SFA stent.  He is doing well with no chest pain, shortness of breath or palpitations.  No lower extremity claudication.  He cut down on tobacco use to 1 cigar a day.   Past Medical History:  Diagnosis Date   Bladder cancer (HCC)    Diabetes mellitus without complication (HCC) 12/12/2011   borderline- not taking prescribed med   Headache(784.0) 08/11/1989   not current problem   Hyperlipidemia    Hypertension    PAD (peripheral artery disease) (HCC) 2023   right arthrectomy, balloon angioplasty rt SFA    Past Surgical History:  Procedure Laterality Date   ABDOMINAL AORTOGRAM W/LOWER EXTREMITY N/A 01/25/2022   Procedure: ABDOMINAL AORTOGRAM W/LOWER EXTREMITY;  Surgeon: Gabriel Ouch, MD;  Location: [facility];  Service: Cardiovascular;  Laterality: N/A;   ABDOMINAL AORTOGRAM W/LOWER EXTREMITY N/A 03/28/2023   Procedure: ABDOMINAL AORTOGRAM W/LOWER EXTREMITY;  Surgeon: Gabriel Ouch, MD;  Location: [facility];  Service: Cardiovascular;  Laterality: N/A;   CYSTOSCOPY W/ URETERAL STENT PLACEMENT Bilateral 09/12/2013   Procedure: CYSTOSCOPY WITH BILATERAL  RETROGRADE PYELOGRAM  ;  Surgeon: Gabriel Ache, MD;  Location: [facility];  Service: Urology;  Laterality: Bilateral;   KNEE ARTHROSCOPY Right many years ago-   doesn't remember date   NASAL POLYP SURGERY  date unknown   PERIPHERAL VASCULAR ATHERECTOMY Right 01/25/2022   Procedure: PERIPHERAL VASCULAR ATHERECTOMY;  Surgeon: Gabriel [name]  A, MD;  Location: [facility];  Service: Cardiovascular;  Laterality: Right;   PERIPHERAL VASCULAR INTERVENTION  03/28/2023   Procedure: PERIPHERAL VASCULAR INTERVENTION;  Surgeon: Gabriel Ouch, MD;  Location: [facility];  Service: Cardiovascular;;   RECTAL SURGERY  date unknown   SHOULDER ARTHROSCOPY WITH SUBACROMIAL  DECOMPRESSION Right 07/28/2022   Procedure: SHOULDER ARTHROSCOPY WITH SUBACROMIAL DECOMPRESSION DISTAL CLAVICLE RESECTION;  Surgeon: Gabriel Hamman, MD;  Location: [facility];  Service: Orthopedics;  Laterality: Right;   TRANSURETHRAL RESECTION OF BLADDER TUMOR WITH GYRUS (TURBT-GYRUS) N/A 09/12/2013   Procedure: TRANSURETHRAL RESECTION OF BLADDER TUMOR WITH GYRUS (TURBT-GYRUS);  Surgeon: Gabriel Ache, MD;  Location: [facility];  Service: Urology;  Laterality: N/A;  1 HR      Current Outpatient Medications  Medication Sig Dispense Refill   amLODipine-olmesartan (AZOR) 10-40 MG tablet Take 1 tablet by mouth daily.     aspirin 81 MG EC tablet Take 81 mg by mouth daily.     atorvastatin (LIPITOR) 20 MG tablet TAKE 1 TABLET(20 MG) BY MOUTH DAILY 90 tablet 1   carvedilol (COREG) 6.25 MG tablet Take 1 tablet (6.25 mg total) by mouth 2 (two) times daily. 180 tablet 3   ciclopirox (PENLAC) 8 % solution Apply 8 % topically at bedtime and may repeat dose one time if needed.     clopidogrel (PLAVIX) 75 MG tablet TAKE 1 TABLET(75 MG) BY MOUTH DAILY 90 tablet 2   hydrochlorothiazide (HYDRODIURIL) 25 MG tablet Take 25 mg by mouth daily.     glyBURIDE-metformin (GLUCOVANCE) 2.5-500 MG tablet Take 1 tablet by mouth daily as needed (High Blood Sugar). (Patient not taking: Reported on 10/30/2023)     pioglitazone (ACTOS) 30 MG tablet Take 30 mg by mouth daily. (Patient not taking: Reported on 10/30/2023)     No current facility-administered medications for this visit.    Allergies:   Patient has no known allergies.    Social History:  The patient  reports that he has been smoking cigars. He has never been exposed to tobacco smoke. He does not have any smokeless tobacco history on file. He reports that he does not drink alcohol and does not use drugs.   Family History:  [name] family history is remarkable for coronary artery disease and hypertension.   ROS:  Please  see the history of present illness.   Otherwise, review of systems are positive for none.   All other systems are reviewed and negative.    PHYSICAL EXAM: VS:  BP 138/70 (BP Location: Left Arm, Patient Position: Sitting, Cuff Size: Normal)   Pulse 75   Ht 6\' 2"  (1.88 m)   Wt 157 lb 9.6 oz (71.5 kg)   SpO2 99%   BMI 20.23 kg/m  , BMI Body mass index is 20.23 kg/m. GEN: Well nourished, well developed, in no acute distress  HEENT: normal  Neck: no JVD,  or masses.  Right carotid bruit. Cardiac: RRR; no murmurs, rubs, or gallops,no edema  Respiratory:  clear to auscultation bilaterally, normal work of breathing GI: soft, nontender, nondistended, + BS MS: no deformity or atrophy  Skin: warm and dry, no rash Neuro:  Strength and sensation are intact Psych: euthymic mood, full affect Vascular: Femoral pulses are normal bilaterally.    EKG:  EKG is ordered today. EKG showed :  Normal sinus rhythm with sinus arrhythmia Minimal voltage criteria for LVH, may be normal variant ( Sokolow-Lyon ) Nonspecific T wave abnormality When  compared with ECG of 04-Apr-2022 18:43, ST no longer depressed in Inferior leads Nonspecific T wave abnormality has replaced inverted T waves in Inferior leads      Recent Labs: 02/11/2023: ALT 18 03/23/2023: BUN 20; Creatinine, Ser 1.19; Hemoglobin 12.9; Platelets 253; Potassium 3.5; Sodium 145    Lipid Panel    Component Value Date/Time   CHOL 118 04/18/2022 0840   TRIG 394 (H) 04/18/2022 0840   HDL 28 (L) 04/18/2022 0840   CHOLHDL 4.2 04/18/2022 0840   LDLCALC 33 04/18/2022 0840      Wt Readings from Last 3 Encounters:  10/30/23 157 lb 9.6 oz (71.5 kg)  04/27/23 175 lb 3.2 oz (79.5 kg)  03/28/23 165 lb (74.8 kg)           No data to display            ASSESSMENT AND PLAN:  1.  Peripheral arterial disease: Status post right SFA directional atherectomy and drug-coated balloon angioplasty as well as left SFA drug eluting stent  placement.  I refilled clopidogrel.  Repeat Doppler studies next week.  No recurrent claudication at this time.    2.  Moderate right carotid stenosis: He has stable 40 to 59% stenosis in the right carotid artery.  I requested follow-up carotid Doppler to be done in February of next year.  3.  Hyperlipidemia: Continue treatment with atorvastatin.  Repeat lipid profile showed improvement in LDL which was down to 33.  4.  Refractory hypertension: Blood pressure is well controlled on current medications.  5.  Cigar use: I discussed with him the importance of cessation.  He cut down to 1 cigar a day.    Disposition: Follow-up in 6 months.  Signed,  Gabriel Bears, MD  10/30/2023 8:21 AM    [facility]

## 2023-11-01 IMAGING — DX DG CHEST 1V
1 series · 1 of 1 positions shown · non-contrast
Comparison: 11/24/2015

CLINICAL DATA: Hypertension with no symptoms.

EXAM:
CHEST  1 VIEW

[w chest pa]
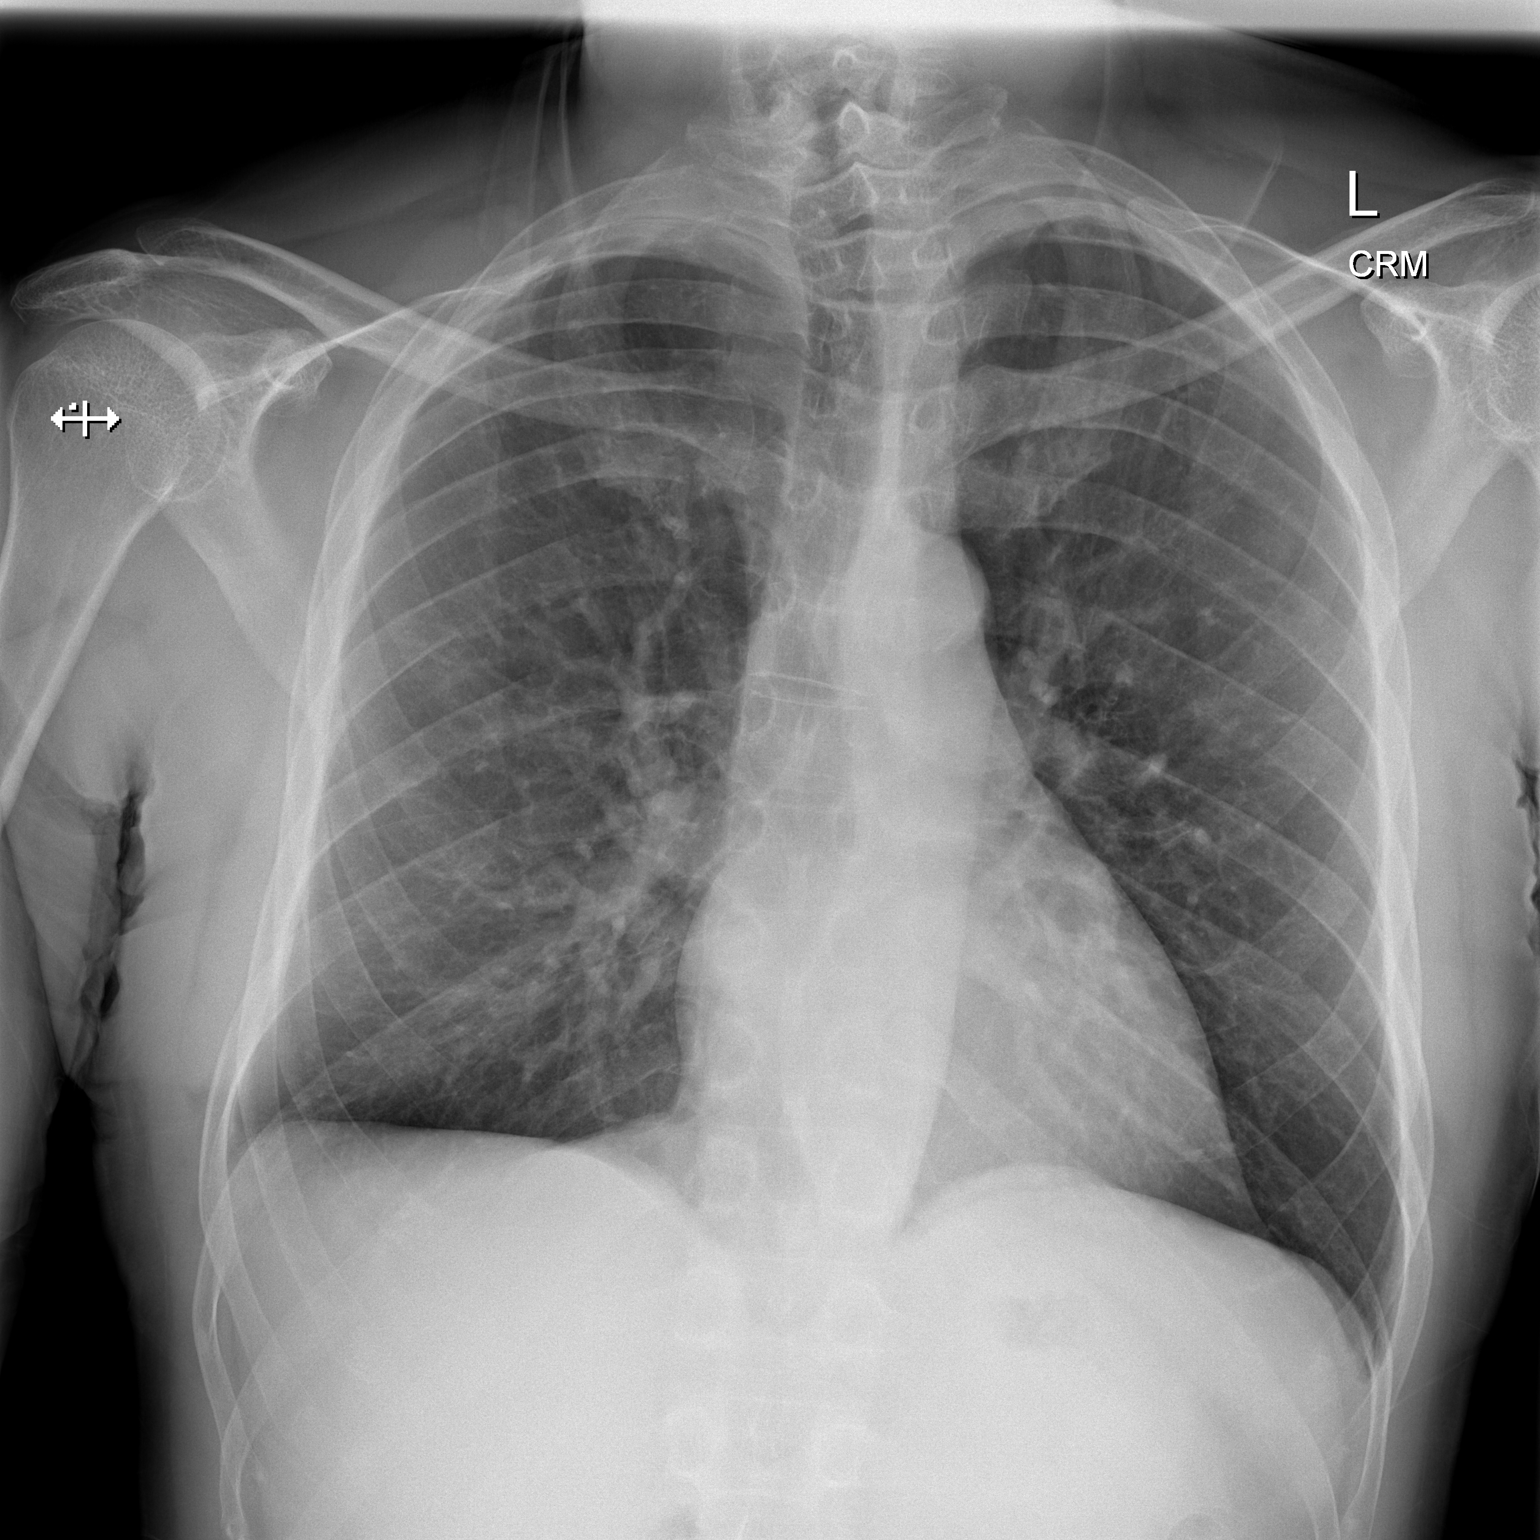

[1 of 1 positions shown; findings below may reference images not displayed]

FINDINGS: The heart is normal in size.The cardiomediastinal contours are
normal. The lungs are clear. Pulmonary vasculature is normal. No
consolidation, pleural effusion, or pneumothorax. No acute osseous
abnormalities are seen.
IMPRESSION: No acute chest findings.

## 2023-11-06 ENCOUNTER — Encounter: Payer: Self-pay | Admitting: *Deleted

## 2023-11-06 NOTE — Progress Notes (Signed)
Pt attended 10/11/23 screening event where his AIC was 6.9. At the event pt noted he had a PCP and was given info by the event nurse about diabetes lifestyle management, and pt did not note any SDOH insecurities. Chart review indicates pt last saw his PCP Dr. Everlene Other from [facility] On 01/24/23; and his last prior A1C on 11/22/22 was 8.3. PCP's furture appt are not visible in [facility]. Also per chart review, pt had a cardiology appt on 10/30/23 where his b/p was 138/70. No additional health equity team support indicated at this time.

## 2023-11-07 ENCOUNTER — Ambulatory Visit (HOSPITAL_COMMUNITY)
Admission: RE | Admit: 2023-11-07 | Payer: No Typology Code available for payment source | Source: Ambulatory Visit | Attending: Cardiovascular Disease | Admitting: Cardiovascular Disease

## 2023-11-10 ENCOUNTER — Other Ambulatory Visit (HOSPITAL_COMMUNITY): Payer: Self-pay

## 2023-11-26 ENCOUNTER — Ambulatory Visit (INDEPENDENT_AMBULATORY_CARE_PROVIDER_SITE_OTHER): Payer: 59 | Admitting: Podiatry

## 2023-11-26 ENCOUNTER — Ambulatory Visit (INDEPENDENT_AMBULATORY_CARE_PROVIDER_SITE_OTHER): Payer: 59

## 2023-11-26 ENCOUNTER — Encounter: Payer: Self-pay | Admitting: Podiatry

## 2023-11-26 DIAGNOSIS — M21619 Bunion of unspecified foot: Secondary | ICD-10-CM

## 2023-11-26 DIAGNOSIS — M79674 Pain in right toe(s): Secondary | ICD-10-CM | POA: Diagnosis not present

## 2023-11-26 DIAGNOSIS — M21612 Bunion of left foot: Secondary | ICD-10-CM

## 2023-11-26 DIAGNOSIS — L84 Corns and callosities: Secondary | ICD-10-CM | POA: Diagnosis not present

## 2023-11-26 DIAGNOSIS — D689 Coagulation defect, unspecified: Secondary | ICD-10-CM | POA: Diagnosis not present

## 2023-11-26 DIAGNOSIS — M79675 Pain in left toe(s): Secondary | ICD-10-CM

## 2023-11-26 DIAGNOSIS — I999 Unspecified disorder of circulatory system: Secondary | ICD-10-CM

## 2023-11-26 DIAGNOSIS — B351 Tinea unguium: Secondary | ICD-10-CM

## 2023-11-26 NOTE — Progress Notes (Signed)
Subjective:   Patient ID: Gabriel [name], male   DOB: 67 y.o.   MRN: [id number]   HPI Patient presents stating he feels like his bunion has grown and become inflamed left he is concerned about trauma and also has a painful group of lesions and thick nailbeds with pain that is been chronic.  Patient does have vascular disease has had a stent in both legs and is currently followed by vascular Dr.   Linus Orn      Objective:  Physical Exam  Vascular status indicates pulses PT +2/4 DP +0/4 with diminishment of digital flow of a mild to moderate in nature.  He is found to have significant structural bunion deformity left over [name] with what appears to be some increase in the density size over the last few months and has keratotic lesions subfirst metatarsal fifth metatarsal both feet painful and significant thickness nailbeds 1-5 both feet painful.  Patient is also on blood thinner and is high risk     Assessment:  Chronic lesion formation bilateral and mycotic nail infection with pain 1-5 both feet     Plan:  H&P reviewed and I went ahead today and I debrided lesions bilateral no iatrogenic bleeding and I debrided nailbeds 1-5 both feet no iatrogenic bleeding with patient to be seen back to reevaluate  X-rays indicate significant structural bunion deformity left with fluid around the outside slight increase in size over the last few months

## 2023-11-30 ENCOUNTER — Ambulatory Visit (HOSPITAL_COMMUNITY)
Admission: RE | Admit: 2023-11-30 | Payer: No Typology Code available for payment source | Source: Ambulatory Visit | Attending: Cardiovascular Disease | Admitting: Cardiovascular Disease

## 2023-11-30 ENCOUNTER — Encounter (HOSPITAL_COMMUNITY): Payer: Self-pay

## 2024-01-31 ENCOUNTER — Ambulatory Visit (HOSPITAL_COMMUNITY)
Admission: RE | Admit: 2024-01-31 | Discharge: 2024-01-31 | Disposition: A | Discharge status: Home / Self Care | Payer: No Typology Code available for payment source | Source: Ambulatory Visit | Attending: Cardiovascular Disease | Admitting: Cardiovascular Disease

## 2024-01-31 DIAGNOSIS — I6521 Occlusion and stenosis of right carotid artery: Secondary | ICD-10-CM | POA: Insufficient documentation

## 2024-01-31 DIAGNOSIS — I779 Disorder of arteries and arterioles, unspecified: Secondary | ICD-10-CM | POA: Insufficient documentation

## 2024-02-01 ENCOUNTER — Other Ambulatory Visit (HOSPITAL_COMMUNITY): Payer: Self-pay | Admitting: *Deleted

## 2024-02-01 DIAGNOSIS — I779 Disorder of arteries and arterioles, unspecified: Secondary | ICD-10-CM

## 2024-02-25 ENCOUNTER — Encounter: Payer: Self-pay | Admitting: Podiatry

## 2024-02-25 ENCOUNTER — Ambulatory Visit (INDEPENDENT_AMBULATORY_CARE_PROVIDER_SITE_OTHER): Payer: 59 | Admitting: Podiatry

## 2024-02-25 VITALS — Ht 74.0 in | Wt 157.0 lb

## 2024-02-25 DIAGNOSIS — D689 Coagulation defect, unspecified: Secondary | ICD-10-CM | POA: Diagnosis not present

## 2024-02-25 DIAGNOSIS — M79674 Pain in right toe(s): Secondary | ICD-10-CM | POA: Diagnosis not present

## 2024-02-25 DIAGNOSIS — L84 Corns and callosities: Secondary | ICD-10-CM

## 2024-02-25 DIAGNOSIS — B351 Tinea unguium: Secondary | ICD-10-CM | POA: Diagnosis not present

## 2024-02-25 DIAGNOSIS — M79675 Pain in left toe(s): Secondary | ICD-10-CM

## 2024-02-25 NOTE — Progress Notes (Signed)
 Subjective:   Patient ID: Gabriel [name], male   DOB: 68 y.o.   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

## 2024-04-28 ENCOUNTER — Encounter: Payer: Self-pay | Admitting: Cardiovascular Disease

## 2024-04-28 NOTE — Telephone Encounter (Signed)
 Error

## 2024-04-29 ENCOUNTER — Ambulatory Visit: Payer: Self-pay | Attending: Cardiovascular Disease | Admitting: Cardiovascular Disease

## 2024-04-29 ENCOUNTER — Encounter: Payer: Self-pay | Admitting: Cardiovascular Disease

## 2024-04-29 VITALS — BP 130/70 | HR 58 | Ht 74.0 in | Wt 159.0 lb

## 2024-04-29 DIAGNOSIS — I739 Peripheral vascular disease, unspecified: Secondary | ICD-10-CM

## 2024-04-29 DIAGNOSIS — I10 Essential (primary) hypertension: Secondary | ICD-10-CM | POA: Diagnosis not present

## 2024-04-29 DIAGNOSIS — E785 Hyperlipidemia, unspecified: Secondary | ICD-10-CM

## 2024-04-29 DIAGNOSIS — I779 Disorder of arteries and arterioles, unspecified: Secondary | ICD-10-CM

## 2024-04-29 MED ORDER — ATORVASTATIN CALCIUM 20 MG PO TABS: 20.0000 mg | ORAL_TABLET | Freq: Every day | ORAL | 3 refills | Status: AC

## 2024-04-29 NOTE — Progress Notes (Signed)
 Cardiology Office Note   Date:  04/29/2024   ID:  Gabriel [name], DOB 1956/11/03, MRN [id number]  PCP:  Alfredia Ina, MD  Cardiologist:   Antionette Kirks, MD   No chief complaint on file.     History of Present Illness: Gabriel [name] is a 68 y.o. male who is here today for follow-up visit regarding peripheral arterial disease and uncontrolled hypertension.   He has no previous cardiac history.  He has known history of essential hypertension, borderline diabetes and previous bladder cancer.  He smokes cigars daily.  He reports family history of coronary artery disease. He was seen by [facility] cardiology in 2022 for atypical chest pain.  He underwent a treadmill nuclear stress test which showed no evidence of ischemia. He was seen in February of 2023 for severe right calf claudication and numbness in both feet.  He underwent noninvasive vascular studies which showed an ABI of 0.63 on the right and 0.88 on the left.  Duplex showed severe stenosis in the right distal SFA and moderate left SFA disease.    Angiography in February 2023 showed no significant renal artery stenosis, no significant aortoiliac disease and severe subtotal occlusion of the right SFA with significant below the knee disease.  I performed directional arthrectomy and drug-coated balloon angioplasty to the right SFA.   He is known to have refractory hypertension with no evidence of renal artery stenosis.    He had worsening left calf claudication in 2024.  Angiography was done in April 2024  which showed significant stenosis in the left mid SFA with moderate popliteal artery stenosis and two-vessel runoff below the knee.  I performed angioplasty and drug-eluting stent placement to the mid/distal SFA. Postprocedure Doppler studies showed mildly reduced ABI with patent left SFA stent.  He has been doing well with no chest pain or shortness of breath.  No lower extremity claudication.  He does complain of left arm and left  shoulder discomfort with certain movements.   Past Medical History:  Diagnosis Date   Bladder cancer (HCC)    Diabetes mellitus without complication (HCC) 12/12/2011   borderline- not taking prescribed med   Headache(784.0) 08/11/1989   not current problem   Hyperlipidemia    Hypertension    PAD (peripheral artery disease) (HCC) 2023   right arthrectomy, balloon angioplasty rt SFA    Past Surgical History:  Procedure Laterality Date   ABDOMINAL AORTOGRAM W/LOWER EXTREMITY N/A 01/25/2022   Procedure: ABDOMINAL AORTOGRAM W/LOWER EXTREMITY;  Surgeon: Wenona Hamilton, MD;  Location: [facility];  Service: Cardiovascular;  Laterality: N/A;   ABDOMINAL AORTOGRAM W/LOWER EXTREMITY N/A 03/28/2023   Procedure: ABDOMINAL AORTOGRAM W/LOWER EXTREMITY;  Surgeon: Wenona Hamilton, MD;  Location: [facility];  Service: Cardiovascular;  Laterality: N/A;   CYSTOSCOPY W/ URETERAL STENT PLACEMENT Bilateral 09/12/2013   Procedure: CYSTOSCOPY WITH BILATERAL  RETROGRADE PYELOGRAM  ;  Surgeon: Osborn Blaze, MD;  Location: [facility];  Service: Urology;  Laterality: Bilateral;   KNEE ARTHROSCOPY Right many years ago-   doesn't remember date   NASAL POLYP SURGERY  date unknown   PERIPHERAL VASCULAR ATHERECTOMY Right 01/25/2022   Procedure: PERIPHERAL VASCULAR ATHERECTOMY;  Surgeon: Wenona Hamilton, MD;  Location: [facility];  Service: Cardiovascular;  Laterality: Right;   PERIPHERAL VASCULAR INTERVENTION  03/28/2023   Procedure: PERIPHERAL VASCULAR INTERVENTION;  Surgeon: Wenona Hamilton, MD;  Location: [facility];  Service: Cardiovascular;;   RECTAL  SURGERY  date unknown   SHOULDER ARTHROSCOPY WITH SUBACROMIAL DECOMPRESSION Right 07/28/2022   Procedure: SHOULDER ARTHROSCOPY WITH SUBACROMIAL DECOMPRESSION DISTAL CLAVICLE RESECTION;  Surgeon: Marlena Sima, MD;  Location: [facility];  Service: Orthopedics;  Laterality: Right;   TRANSURETHRAL  RESECTION OF BLADDER TUMOR WITH GYRUS (TURBT-GYRUS) N/A 09/12/2013   Procedure: TRANSURETHRAL RESECTION OF BLADDER TUMOR WITH GYRUS (TURBT-GYRUS);  Surgeon: Osborn Blaze, MD;  Location: [facility];  Service: Urology;  Laterality: N/A;  1 HR      Current Outpatient Medications  Medication Sig Dispense Refill   amLODipine -olmesartan  (AZOR) 10-40 MG tablet Take 1 tablet by mouth daily.     aspirin  81 MG EC tablet Take 81 mg by mouth daily.     carvedilol  (COREG ) 6.25 MG tablet Take 1 tablet (6.25 mg total) by mouth 2 (two) times daily. 180 tablet 3   hydrochlorothiazide (HYDRODIURIL) 25 MG tablet Take 25 mg by mouth daily.     atorvastatin  (LIPITOR) 20 MG tablet Take 1 tablet (20 mg total) by mouth daily. 90 tablet 3   ciclopirox (PENLAC) 8 % solution Apply 8 % topically at bedtime and may repeat dose one time if needed. (Patient not taking: Reported on 04/29/2024)     glyBURIDE-metformin (GLUCOVANCE) 2.5-500 MG tablet Take 1 tablet by mouth daily as needed (High Blood Sugar). (Patient not taking: Reported on 04/29/2024)     pioglitazone (ACTOS) 30 MG tablet Take 30 mg by mouth daily. (Patient not taking: Reported on 04/29/2024)     No current facility-administered medications for this visit.    Allergies:   Patient has no known allergies.    Social History:  The patient  reports that he has been smoking cigars. He has never been exposed to tobacco smoke. He does not have any smokeless tobacco history on file. He reports that he does not drink alcohol and does not use drugs.   Family History:  [name] family history is remarkable for coronary artery disease and hypertension.   ROS:  Please see the history of present illness.   Otherwise, review of systems are positive for none.   All other systems are reviewed and negative.    PHYSICAL EXAM: VS:  BP 130/70   Pulse (!) 58   Ht 6\' 2"  (1.88 m)   Wt 159 lb (72.1 kg)   SpO2 92%   BMI 20.41 kg/m  , BMI Body mass index  is 20.41 kg/m. GEN: Well nourished, well developed, in no acute distress  HEENT: normal  Neck: no JVD,  or masses.  Right carotid bruit. Cardiac: RRR; no murmurs, rubs, or gallops,no edema  Respiratory:  clear to auscultation bilaterally, normal work of breathing GI: soft, nontender, nondistended, + BS MS: no deformity or atrophy  Skin: warm and dry, no rash Neuro:  Strength and sensation are intact Psych: euthymic mood, full affect Vascular: Femoral pulses are normal bilaterally.    EKG:  EKG is ordered today. EKG showed :  Sinus bradycardia with Premature atrial complexes Possible Left atrial enlargement Left ventricular hypertrophy ( Sokolow-Lyon , Cornell product , Romhilt-Estes ) T wave abnormality, consider lateral ischemia When compared with ECG of 30-Oct-2023 08:15, Premature atrial complexes are now Present       Recent Labs: No results found for requested labs within last 365 days.    Lipid Panel    Component Value Date/Time   CHOL 118 04/18/2022 0840   TRIG 394 (H) 04/18/2022 0840   HDL 28 (L) 04/18/2022  0840   CHOLHDL 4.2 04/18/2022 0840   LDLCALC 33 04/18/2022 0840      Wt Readings from Last 3 Encounters:  04/29/24 159 lb (72.1 kg)  02/25/24 157 lb (71.2 kg)  10/30/23 157 lb 9.6 oz (71.5 kg)           No data to display            ASSESSMENT AND PLAN:  1.  Peripheral arterial disease: Status post right SFA directional atherectomy and drug-coated balloon angioplasty as well as left SFA drug eluting stent placement.  I requested a follow-up ABI and lower extremity arterial Doppler.  He has no claudication at the present time.  2.  Moderate right carotid stenosis: This was stable on most recent carotid Doppler in February 2025.  Repeat study in February 2026.    3.  Hyperlipidemia: Unfortunately, he stopped atorvastatin  on his own.  I discussed with him the rationale for treatment with a statin.  He agreed to resume atorvastatin  20 mg daily  which was refilled today.  4.  Refractory hypertension: Blood pressure is well controlled on current medications.  5.  Cigar use: I discussed with him the importance of cessation.    Disposition: Follow-up in 6 months.  Signed,  Antionette Kirks, MD  04/29/2024 1:08 PM    [facility]

## 2024-04-29 NOTE — Patient Instructions (Signed)
 Medication Instructions:  No changes *If you need a refill on your cardiac medications before your next appointment, please call your pharmacy*  Lab Work: None ordered If you have labs (blood work) drawn today and your tests are completely normal, you will receive your results only by: MyChart Message (if you have MyChart) OR A paper copy in the mail If you have any lab test that is abnormal or we need to change your treatment, we will call you to review the results.  Testing/Procedures: Your physician has requested that you have an ankle brachial index (ABI). During this test an ultrasound and blood pressure cuff are used to evaluate the arteries that supply the arms and legs with blood. Allow thirty minutes for this exam. There are no restrictions or special instructions. This will take place at [address] [facility], 4th floor  Your physician has requested that you have a lower extremity arterial duplex. During this test, ultrasound is used to evaluate arterial blood flow in the legs. Allow one hour for this exam. There are no restrictions or special instructions. This will take place at [address] 4th floor  Please note: We ask at that you not bring children with you during ultrasound (echo/ vascular) testing. Due to room size and safety concerns, children are not allowed in the ultrasound rooms during exams. Our front office staff cannot provide observation of children in our lobby area while testing is being conducted. An adult accompanying a patient to their appointment will only be allowed in the ultrasound room at the discretion of the ultrasound technician under special circumstances. We apologize for any inconvenience.    Follow-Up: At [facility], you and your health needs are our priority.  As part of our continuing mission to provide you with exceptional heart care, our providers are all part of one team.  This team includes your primary Cardiologist (physician) and  Advanced Practice Providers or APPs (Physician Assistants and Nurse Practitioners) who all work together to provide you with the care you need, when you need it.  Your next appointment:   6 month(s)  Provider:   Antionette Kirks, MD    We recommend signing up for the patient portal called "MyChart".  Sign up information is provided on this After Visit Summary.  MyChart is used to connect with patients for Virtual Visits (Telemedicine).  Patients are able to view lab/test results, encounter notes, upcoming appointments, etc.  Non-urgent messages can be sent to your provider as well.   To learn more about what you can do with MyChart, go to [email or web address].

## 2024-05-29 ENCOUNTER — Encounter (HOSPITAL_COMMUNITY)

## 2024-06-27 ENCOUNTER — Ambulatory Visit (HOSPITAL_BASED_OUTPATIENT_CLINIC_OR_DEPARTMENT_OTHER)
Admission: RE | Admit: 2024-06-27 | Discharge: 2024-06-27 | Disposition: A | Discharge status: Home / Self Care | Source: Ambulatory Visit | Attending: Cardiovascular Disease | Admitting: Cardiovascular Disease

## 2024-06-27 ENCOUNTER — Ambulatory Visit (HOSPITAL_COMMUNITY)
Admission: RE | Admit: 2024-06-27 | Discharge: 2024-06-27 | Disposition: A | Discharge status: Home / Self Care | Source: Ambulatory Visit | Attending: Cardiovascular Disease | Admitting: Cardiovascular Disease

## 2024-06-27 DIAGNOSIS — I739 Peripheral vascular disease, unspecified: Secondary | ICD-10-CM | POA: Diagnosis not present

## 2024-06-28 LAB — VAS US ABI WITH/WO TBI
Left ABI: 0.84
Right ABI: 0.81

## 2024-07-01 ENCOUNTER — Ambulatory Visit: Payer: Self-pay | Admitting: Internal Medicine

## 2024-07-01 DIAGNOSIS — I739 Peripheral vascular disease, unspecified: Secondary | ICD-10-CM

## 2024-07-27 ENCOUNTER — Other Ambulatory Visit: Payer: Self-pay | Admitting: Cardiovascular Disease

## 2024-07-27 DIAGNOSIS — I10 Essential (primary) hypertension: Secondary | ICD-10-CM

## 2024-12-30 ENCOUNTER — Encounter (HOSPITAL_COMMUNITY): Payer: Self-pay

## 2025-01-09 ENCOUNTER — Encounter (HOSPITAL_COMMUNITY): Payer: Self-pay
